# Patient Record
Sex: Male | Born: 1960
Health system: Southern US, Community
[De-identification: ages and names within clinical notes are randomized; demographics above are authoritative.]

## PROBLEM LIST (undated history)

## (undated) DIAGNOSIS — I2699 Other pulmonary embolism without acute cor pulmonale: Secondary | ICD-10-CM

## (undated) DIAGNOSIS — J45909 Unspecified asthma, uncomplicated: Secondary | ICD-10-CM

## (undated) HISTORY — DX: Unspecified asthma, uncomplicated: J45.909

## (undated) HISTORY — PX: ROTATOR CUFF REPAIR: SHX139

---

## 1988-12-06 HISTORY — PX: TONSILLECTOMY: SUR1361

## 1988-12-06 HISTORY — PX: ADENOIDECTOMY: SUR15

## 2016-08-19 DIAGNOSIS — G8929 Other chronic pain: Secondary | ICD-10-CM | POA: Insufficient documentation

## 2016-08-19 DIAGNOSIS — M25511 Pain in right shoulder: Secondary | ICD-10-CM

## 2016-08-19 HISTORY — DX: Other chronic pain: G89.29

## 2016-08-19 HISTORY — DX: Pain in right shoulder: M25.511

## 2016-12-06 HISTORY — PX: ROTATOR CUFF REPAIR: SHX139

## 2017-02-22 DIAGNOSIS — I2699 Other pulmonary embolism without acute cor pulmonale: Secondary | ICD-10-CM

## 2017-02-22 DIAGNOSIS — Z9189 Other specified personal risk factors, not elsewhere classified: Secondary | ICD-10-CM

## 2017-02-22 DIAGNOSIS — Z9889 Other specified postprocedural states: Secondary | ICD-10-CM

## 2017-02-22 DIAGNOSIS — R911 Solitary pulmonary nodule: Secondary | ICD-10-CM

## 2017-02-22 DIAGNOSIS — J189 Pneumonia, unspecified organism: Secondary | ICD-10-CM | POA: Diagnosis not present

## 2017-02-22 DIAGNOSIS — J9601 Acute respiratory failure with hypoxia: Secondary | ICD-10-CM | POA: Diagnosis not present

## 2017-02-22 HISTORY — DX: Other specified personal risk factors, not elsewhere classified: Z91.89

## 2017-02-22 HISTORY — DX: Solitary pulmonary nodule: R91.1

## 2017-02-23 DIAGNOSIS — J9601 Acute respiratory failure with hypoxia: Secondary | ICD-10-CM | POA: Diagnosis not present

## 2017-02-23 DIAGNOSIS — Z9889 Other specified postprocedural states: Secondary | ICD-10-CM | POA: Diagnosis not present

## 2017-02-23 DIAGNOSIS — I2699 Other pulmonary embolism without acute cor pulmonale: Secondary | ICD-10-CM | POA: Diagnosis not present

## 2017-02-23 DIAGNOSIS — J189 Pneumonia, unspecified organism: Secondary | ICD-10-CM | POA: Diagnosis not present

## 2017-02-24 DIAGNOSIS — I2699 Other pulmonary embolism without acute cor pulmonale: Secondary | ICD-10-CM | POA: Diagnosis not present

## 2017-02-24 DIAGNOSIS — J189 Pneumonia, unspecified organism: Secondary | ICD-10-CM | POA: Diagnosis not present

## 2017-02-24 DIAGNOSIS — Z9889 Other specified postprocedural states: Secondary | ICD-10-CM | POA: Diagnosis not present

## 2017-02-24 DIAGNOSIS — J9601 Acute respiratory failure with hypoxia: Secondary | ICD-10-CM | POA: Diagnosis not present

## 2017-02-26 ENCOUNTER — Emergency Department (HOSPITAL_COMMUNITY): Payer: Managed Care, Other (non HMO)

## 2017-02-26 ENCOUNTER — Inpatient Hospital Stay (HOSPITAL_COMMUNITY): Admit: 2017-02-26 | Payer: Managed Care, Other (non HMO)

## 2017-02-26 ENCOUNTER — Encounter (HOSPITAL_COMMUNITY): Payer: Self-pay

## 2017-02-26 ENCOUNTER — Inpatient Hospital Stay (HOSPITAL_COMMUNITY)
Admission: EM | Admit: 2017-02-26 | Discharge: 2017-02-28 | DRG: 175 | Disposition: A | Discharge status: Home / Self Care | Payer: Managed Care, Other (non HMO) | Attending: Internal Medicine | Admitting: Internal Medicine

## 2017-02-26 DIAGNOSIS — J189 Pneumonia, unspecified organism: Secondary | ICD-10-CM

## 2017-02-26 DIAGNOSIS — R0602 Shortness of breath: Secondary | ICD-10-CM

## 2017-02-26 DIAGNOSIS — R0781 Pleurodynia: Secondary | ICD-10-CM

## 2017-02-26 DIAGNOSIS — I2699 Other pulmonary embolism without acute cor pulmonale: Secondary | ICD-10-CM

## 2017-02-26 DIAGNOSIS — J181 Lobar pneumonia, unspecified organism: Secondary | ICD-10-CM

## 2017-02-26 DIAGNOSIS — R079 Chest pain, unspecified: Secondary | ICD-10-CM | POA: Diagnosis not present

## 2017-02-26 DIAGNOSIS — I2602 Saddle embolus of pulmonary artery with acute cor pulmonale: Secondary | ICD-10-CM | POA: Diagnosis not present

## 2017-02-26 DIAGNOSIS — R072 Precordial pain: Secondary | ICD-10-CM

## 2017-02-26 HISTORY — DX: Other pulmonary embolism without acute cor pulmonale: I26.99

## 2017-02-26 HISTORY — DX: Pleurodynia: R07.81

## 2017-02-26 HISTORY — DX: Pneumonia, unspecified organism: J18.9

## 2017-02-26 LAB — URINALYSIS, ROUTINE W REFLEX MICROSCOPIC
Bilirubin Urine: NEGATIVE
GLUCOSE, UA: NEGATIVE mg/dL
HGB URINE DIPSTICK: NEGATIVE
Ketones, ur: NEGATIVE mg/dL
Leukocytes, UA: NEGATIVE
Nitrite: NEGATIVE
Protein, ur: NEGATIVE mg/dL
SPECIFIC GRAVITY, URINE: 1.024 (ref 1.005–1.030)
pH: 5 (ref 5.0–8.0)

## 2017-02-26 LAB — BASIC METABOLIC PANEL
Anion gap: 12 (ref 5–15)
BUN: 18 mg/dL (ref 6–20)
CALCIUM: 9.3 mg/dL (ref 8.9–10.3)
CO2: 21 mmol/L — ABNORMAL LOW (ref 22–32)
CREATININE: 1.05 mg/dL (ref 0.61–1.24)
Chloride: 101 mmol/L (ref 101–111)
GLUCOSE: 131 mg/dL — AB (ref 65–99)
Potassium: 4.1 mmol/L (ref 3.5–5.1)
SODIUM: 134 mmol/L — AB (ref 135–145)

## 2017-02-26 LAB — CBC
HCT: 43.3 % (ref 39.0–52.0)
Hemoglobin: 14.5 g/dL (ref 13.0–17.0)
MCH: 28 pg (ref 26.0–34.0)
MCHC: 33.5 g/dL (ref 30.0–36.0)
MCV: 83.6 fL (ref 78.0–100.0)
PLATELETS: 233 10*3/uL (ref 150–400)
RBC: 5.18 MIL/uL (ref 4.22–5.81)
RDW: 12.9 % (ref 11.5–15.5)
WBC: 10.3 10*3/uL (ref 4.0–10.5)

## 2017-02-26 LAB — I-STAT TROPONIN, ED: TROPONIN I, POC: 0 ng/mL (ref 0.00–0.08)

## 2017-02-26 LAB — PROTIME-INR
INR: 1.42
Prothrombin Time: 17.5 seconds — ABNORMAL HIGH (ref 11.4–15.2)

## 2017-02-26 LAB — BRAIN NATRIURETIC PEPTIDE: B Natriuretic Peptide: 15.5 pg/mL (ref 0.0–100.0)

## 2017-02-26 LAB — STREP PNEUMONIAE URINARY ANTIGEN: Strep Pneumo Urinary Antigen: NEGATIVE

## 2017-02-26 MED ORDER — DEXTROSE 5 % IV SOLN
500.0000 mg | INTRAVENOUS | Status: DC
  Administered 2017-02-26 – 2017-02-27 (×2): 500 mg via INTRAVENOUS
  Filled 2017-02-26 (×3): qty 500

## 2017-02-26 MED ORDER — ACETAMINOPHEN 325 MG PO TABS
650.0000 mg | ORAL_TABLET | Freq: Four times a day (QID) | ORAL | Status: DC | PRN
  Administered 2017-02-26: 650 mg via ORAL
  Filled 2017-02-26: qty 2

## 2017-02-26 MED ORDER — HYDROMORPHONE HCL 1 MG/ML IJ SOLN
1.0000 mg | Freq: Once | INTRAMUSCULAR | Status: AC
Start: 2017-02-26 — End: 2017-02-26
  Administered 2017-02-26: 1 mg via INTRAVENOUS
  Filled 2017-02-26: qty 1

## 2017-02-26 MED ORDER — SODIUM CHLORIDE 0.9% FLUSH
3.0000 mL | Freq: Two times a day (BID) | INTRAVENOUS | Status: DC
Start: 2017-02-26 — End: 2017-02-28
  Administered 2017-02-26 – 2017-02-27 (×2): 3 mL via INTRAVENOUS

## 2017-02-26 MED ORDER — IBUPROFEN 800 MG PO TABS: 800.0000 mg | ORAL_TABLET | Freq: Four times a day (QID) | ORAL | Status: DC | PRN

## 2017-02-26 MED ORDER — METHOCARBAMOL 500 MG PO TABS
500.0000 mg | ORAL_TABLET | Freq: Four times a day (QID) | ORAL | Status: DC | PRN
  Administered 2017-02-28: 500 mg via ORAL
  Filled 2017-02-26: qty 1

## 2017-02-26 MED ORDER — FLEET ENEMA 7-19 GM/118ML RE ENEM: 1.0000 | ENEMA | Freq: Once | RECTAL | Status: DC | PRN

## 2017-02-26 MED ORDER — SODIUM CHLORIDE 0.9% FLUSH: 3.0000 mL | INTRAVENOUS | Status: DC | PRN

## 2017-02-26 MED ORDER — DEXTROSE 5 % IV SOLN
1.0000 g | INTRAVENOUS | Status: DC
  Administered 2017-02-26 – 2017-02-27 (×2): 1 g via INTRAVENOUS
  Filled 2017-02-26 (×4): qty 10

## 2017-02-26 MED ORDER — DOCUSATE SODIUM 100 MG PO CAPS
100.0000 mg | ORAL_CAPSULE | Freq: Two times a day (BID) | ORAL | Status: DC
  Administered 2017-02-26 – 2017-02-28 (×5): 100 mg via ORAL
  Filled 2017-02-26 (×5): qty 1

## 2017-02-26 MED ORDER — SENNOSIDES-DOCUSATE SODIUM 8.6-50 MG PO TABS: 1.0000 | ORAL_TABLET | Freq: Every evening | ORAL | Status: DC | PRN

## 2017-02-26 MED ORDER — OXYCODONE-ACETAMINOPHEN 5-325 MG PO TABS
1.0000 | ORAL_TABLET | ORAL | Status: DC | PRN
  Administered 2017-02-26 – 2017-02-27 (×4): 2 via ORAL
  Filled 2017-02-26 (×4): qty 2

## 2017-02-26 MED ORDER — ENOXAPARIN SODIUM 100 MG/ML ~~LOC~~ SOLN
100.0000 mg | Freq: Two times a day (BID) | SUBCUTANEOUS | Status: DC
  Administered 2017-02-26 – 2017-02-28 (×4): 100 mg via SUBCUTANEOUS
  Filled 2017-02-26 (×4): qty 1

## 2017-02-26 MED ORDER — KETOROLAC TROMETHAMINE 30 MG/ML IJ SOLN
30.0000 mg | Freq: Four times a day (QID) | INTRAMUSCULAR | Status: DC | PRN
  Administered 2017-02-26: 30 mg via INTRAVENOUS
  Filled 2017-02-26: qty 1

## 2017-02-26 MED ORDER — BISACODYL 10 MG RE SUPP: 10.0000 mg | Freq: Every day | RECTAL | Status: DC | PRN

## 2017-02-26 MED ORDER — IOPAMIDOL (ISOVUE-370) INJECTION 76%
INTRAVENOUS | Status: AC
  Administered 2017-02-26: 100 mL via INTRAVENOUS
  Filled 2017-02-26: qty 100

## 2017-02-26 MED ORDER — ENOXAPARIN SODIUM 100 MG/ML ~~LOC~~ SOLN
100.0000 mg | SUBCUTANEOUS | Status: DC
  Filled 2017-02-26: qty 1

## 2017-02-26 MED ORDER — MORPHINE SULFATE (PF) 4 MG/ML IV SOLN
4.0000 mg | Freq: Once | INTRAVENOUS | Status: DC
  Filled 2017-02-26: qty 1

## 2017-02-26 MED ORDER — ENOXAPARIN SODIUM 100 MG/ML ~~LOC~~ SOLN
100.0000 mg | SUBCUTANEOUS | Status: AC
  Administered 2017-02-26: 100 mg via SUBCUTANEOUS
  Filled 2017-02-26: qty 1

## 2017-02-26 MED ORDER — SODIUM CHLORIDE 0.9 % IV SOLN: 250.0000 mL | INTRAVENOUS | Status: DC | PRN

## 2017-02-26 NOTE — ED Notes (Signed)
Pt aware that urine sample is needed.  

## 2017-02-26 NOTE — H&P (Signed)
[facility] History and Physical  Eric SellsSteven Nanez OZH:[id number] DOB: May 08, 1961 DOA: 02/26/2017  Referring physician: ED MD PCP: Noni SaupeEDDING II,JOHN F., MD   Chief Complaint: pleuritic chest pain  HPI: Eric Myers is a 56 y.o. male with no significant past medical history until about 2 weeks ago when he had right shoulder arthroscope. He secondary to rotator cuff repair.  He subsequently developed shortness of breath, increasing tachycardia and tachypnea and dyspnea on exertion, went to [facility] on [facility]/22/2017 and was found to have new acute pulmonary embolus  as well as pneumonia.  He was transitioned from Lovenox to several total on discharge and sent home with Levaquin 750 mg by mouth daily.  He was doing okay until last night when he developed worsening pleuritic chest pain, tachypnea, tachycardia, and inability to get comfortable. Of note, he also developed a low-grade temperature of 100.9 last night; he was taking Percocets for discomfort as well as fever He came to ED for further evaluation and was noted to have NEW distal right middle lobe PE as well as right heart strain.  Since arrival to the ED, he feels much more comfortable with pain medications.  Of note, patient was having productive cough at home but denies any chest pain.  No dysuria, hematuria, hematochezia, no hemoptysis.  Does not smoke, does not drink alcohol family history negative for hypercoagulable state. No recent lengthy travel prior or  Posts the right shoulder surgery.  Per wife, bilateral lower sternal ultrasounds were done on the 02/22/17 and they were negative for DVTs in the legs.  In ED, initial VS 99.4, 24rr, hr 94, 147/77 Comfortable on my arrival to ED room, wife at bedside and giving me all hx.   Review of Systems:  Per hpi, o/w all systems reviewed and negative.    Past Medical History:  Diagnosis Date  . Pulmonary embolism [facility](HCC)    Past Surgical History:  Procedure Laterality Date  .  ROTATOR CUFF REPAIR     Social History:  reports that he has never smoked. He has never used smokeless tobacco. He reports that he does not drink alcohol. His drug history is not on file.  Allergies  Allergen Reactions  . Morphine And Related Itching and Other (See Comments)    Rashy red areas and doesn't help pain    No family history on file. per pt no family hx of hypercoag state.  Prior to Admission medications   Medication Sig Start Date End Date Taking? Authorizing Provider  levofloxacin (LEVAQUIN) 750 MG tablet Take 750 mg by mouth daily. For 10 days 02/24/17  Yes Historical Provider, MD  methocarbamol (ROBAXIN) 500 MG tablet Take 500 mg by mouth every 6 (six) hours as needed for muscle spasms.   Yes Historical Provider, MD  oxyCODONE-acetaminophen (PERCOCET/ROXICET) 5-325 MG tablet Take 1-2 tablets by mouth every 4 (four) hours as needed for severe pain.   Yes Historical Provider, MD  Rivaroxaban (XARELTO) 15 MG TABS tablet Take 15 mg by mouth 2 (two) times daily with a meal.   Yes Historical Provider, MD   Physical Exam: Vitals:   02/26/17 1030 02/26/17 1100 02/26/17 1130 02/26/17 1200  BP: 131/70 121/83 126/79 115/79  Pulse: 80 77 76 91  Resp: 20 19 20  (!) 24  Temp:      TempSrc:      SpO2: 98% 99% 98% 98%  Weight:      Height:        Wt Readings from Last 3  Encounters:  02/26/17 98.9 kg (218 lb)    General:  Appears calm and comfortable, pleasant, NAD, AAOx3, pleasant Eyes: PERRL, normal lids, irises & conjunctiva ENT: grossly normal hearing, lips & tongue, mmm Neck: no LAD, masses or thyromegaly Cardiovascular: RRR, no m/r/g. No LE edema. No jvp. Telemetry: SR, no arrhythmias  Respiratory: diminished bs due to splinting. Abdomen: soft, ntnd Skin: no rash or induration seen on limited exam Musculoskeletal: grossly normal tone BUE/BLE, neg Homan's sign bilat. Psychiatric: grossly normal mood and affect, speech fluent and appropriate Neurologic: grossly  non-focal.          Labs on Admission:  Basic Metabolic Panel:  Recent Labs Lab 02/26/17 0715  NA 134*  K 4.1  CL 101  CO2 21*  GLUCOSE 131*  BUN 18  CREATININE 1.05  CALCIUM 9.3   Liver Function Tests: No results for input(s): AST, ALT, ALKPHOS, BILITOT, PROT, ALBUMIN in the last 168 hours. No results for input(s): LIPASE, AMYLASE in the last 168 hours. No results for input(s): AMMONIA in the last 168 hours. CBC:  Recent Labs Lab 02/26/17 0715  WBC 10.3  HGB 14.5  HCT 43.3  MCV 83.6  PLT 233   Cardiac Enzymes: No results for input(s): CKTOTAL, CKMB, CKMBINDEX, TROPONINI in the last 168 hours.  BNP (last 3 results)  Recent Labs  02/26/17 0715  BNP 15.5    ProBNP (last 3 results) No results for input(s): PROBNP in the last 8760 hours.  CBG: No results for input(s): GLUCAP in the last 168 hours.  Radiological Exams on Admission: Dg Chest 2 View  Result Date: 02/26/2017 CLINICAL DATA:  Shortness of breath and chest pain EXAM: CHEST  2 VIEW COMPARISON:  None. FINDINGS: There is atelectatic change in the lateral left base. Lungs elsewhere are clear. Heart size and pulmonary vascularity are normal. No adenopathy. No bone lesions. IMPRESSION: Atelectasis lateral left base. Lungs elsewhere clear. Cardiac silhouette within normal limits. Electronically Signed   By: Bretta Bang III M.D.   On: 02/26/2017 07:54   Ct Angio Chest Pe W And/or Wo Contrast  Result Date: 02/26/2017 CLINICAL DATA:  Patient with history of pneumonia right-sided pulmonary embolus. Worsening right-sided chest pain. EXAM: CT ANGIOGRAPHY CHEST WITH CONTRAST TECHNIQUE: Multidetector CT imaging of the chest was performed using the standard protocol during bolus administration of intravenous contrast. Multiplanar CT image reconstructions and MIPs were obtained to evaluate the vascular anatomy. CONTRAST:  100 cc Isovue 370 COMPARISON:  Chest radiograph 02/26/2017 PE study 02/22/2017 FINDINGS:  Cardiovascular: Adequate opacification of the pulmonary arterial system. Filling defects are identified within the right middle and lower lobe lobar and segmental pulmonary arteries. Filling defect within the right middle lobe is new from prior. Additionally new filling defects are demonstrated within the left upper lobe segmental pulmonary arteries. RV/LV ratio of 1.1. Normal heart size. No pericardial effusion. Ascending thoracic aorta measures 4.3 cm. Main pulmonary artery upper limits normal measuring 3.0 cm. Mediastinum/Nodes: Esophagus is unremarkable. Prominent and mildly enlarged right peritracheal lymph nodes measuring up to 11 mm (image 48; series 6). Additionally, within the right peritracheal location there is prominent soft tissue (image 82; series 6) measuring 1.9 cm in diameter, potentially secondary to on opacified azygos. Lungs/Pleura: Central airways are patent. Moderate layering right pleural effusion. Subpleural consolidation within the right lower lobe. Patchy consolidation within the lingula. No pneumothorax. Upper Abdomen: No acute process. Musculoskeletal: Thoracic spine degenerative changes. No aggressive or acute appearing osseous lesions. Review of the MIP images confirms  the above findings. IMPRESSION: Findings compatible with acute pulmonary embolus. There appears to be new thrombus within the distal right middle lobe pulmonary arteries and lingular pulmonary arteries. Minimal residual thrombus within the right lower lobe pulmonary arteries. Positive for acute PE with CT evidence of right heart strain (RV/LV Ratio = 1.1) consistent with at least submassive (intermediate risk) PE. The presence of right heart strain has been associated with an increased risk of morbidity and mortality. Please activate Code PE by paging [phone]. Moderate layering right pleural effusion. Subpleural consolidation within the right lower lobe as well as patchy consolidation within the lingula which may  represent atelectasis or infection. Prominent mildly enlarged right peritracheal lymph nodes. Additionally there is prominent soft tissue at the expected location of the azygos, potentially representing non-opacified azygos. Lymph nodes may be reactive in etiology. Given multitude of pulmonary findings, recommend follow-up chest CT in 2- 3 months to assess for interval resolution. Ascending thoracic aorta measures 4.3 cm. Recommend annual imaging followup by CTA or MRA. This recommendation follows 2010 ACCF/AHA/AATS/ACR/ASA/SCA/SCAI/SIR/STS/SVM Guidelines for the Diagnosis and Management of Patients with Thoracic Aortic Disease. Circulation. 2010; 121: G956-O130 Critical Value/emergent results were called by telephone at the time of interpretation on 02/26/2017 at 10:41 am to Dr. Lynden Oxford , who verbally acknowledged these results. Electronically Signed   By: Annia Belt M.D.   On: 02/26/2017 10:52    EKG: Independently reviewed.   EKG Interpretation  Date/Time:  Saturday February 26 2017 06:49:26 EDT Ventricular Rate:  98 PR Interval:  144 QRS Duration: 90 QT Interval:  338 QTC Calculation: 431 R Axis:   53 Text Interpretation:  Normal sinus rhythm Normal ECG No prior ECG for comparison.  No STEMI Confirmed by Rush Landmark MD, CHRISTOPHER [phone]) on 02/26/2017 7:23:38 AM Also confirmed by Rush Landmark MD, CHRISTOPHER [phone]), editor Misty Stanley [phone])  on 02/26/2017 10:27:05 AM        Assessment/Plan Principal Problem:   Acute pulmonary embolism (HCC) Active Problems:   RLL pneumonia (HCC)   Pleuritic chest pain   1. New PE, bilat, w/ Right heart strain - Initially diagnosed on 02/22/2017 at [facility]: Lambert Rhonda W, however radiologist's reading a new pulmonary emboli noted in the distal right middle lobe pulmonary arteries and lingular pulmonary arteries as compared to [facility] CT on 02/22/2017. - obs tele - emp lovenox 1mg /kg bid for now, hold lovenox - pulm cs,  ER called, appreciate assistance - o2 for now, Resp to check pt's O2 status off o2 while ambulating tomorw to eval home o2 needs - chk echo and bilat LE [place]. - no hypercoag wkup done recently per pt/wife, suspect this occurrence of VTE due to recent surgery, but given severity of PE, may consider hypercoag wkup once treatment completion.  2. rll and lingula consolidation, c/w pna - pt was on levoquin 750mg  at home - will switch to rocephin and azithromycin for now - fu sputum cs/urine legionella and strep  3. Plurisy/pleuritic cp/doe - neg trop on arrival, no ACS - suspect due to #1 and #2 - pain control w/ motrin/percocets, prn toradol iv for severe pain. Pt has allergy to morphine - made allergy.  I requested pulm cs, ER called for me, appreciate assistance  Code Status: full (must indicate code status--if unknown or must be presumed, indicate so) DVT Prophylaxis: lovenox 1mg /kig bid for now, hold xarelto Family Communication: pt and wife at bedside (indicate person spoken with, if applicable, with phone number if by telephone) Disposition Plan: obs tele (  indicate anticipated LOS)  Time spent:  Pete Glatter MD., MBA/MHA [facility] Pager [phone]

## 2017-02-26 NOTE — ED Triage Notes (Signed)
Pt was recently discharged from [facility] for R sided PE, recent rotator cuff surgery, last night pt became very SOB and having CP, states this feels the same as his last PE.

## 2017-02-26 NOTE — ED Notes (Signed)
Wife at bedside questioning why pt is ordered to have blood cultures drawn. Explained to wife that pt has antibiotics ordered used for pneumonia. Wife reports pt does have pneumonia and he has been on antibiotics for 1 week. Wife feels blood cultures would be useless at this time. Admitting Dr paged to inform of same.

## 2017-02-26 NOTE — Progress Notes (Signed)
ANTICOAGULATION CONSULT NOTE - Initial Consult  Pharmacy Consult:  Lovenox Indication:  New RML PE with recent PE  Allergies  Allergen Reactions  . Morphine And Related Itching and Other (See Comments)    Rashy red areas and doesn't help pain    Patient Measurements: Height: 6\' 1"  (185.4 cm) Weight: 218 lb (98.9 kg) IBW/kg (Calculated) : 79.9  Vital Signs: Temp: 99.4 F (37.4 C) (03/24 0647) Temp Source: Oral (03/24 0647) BP: 115/79 (03/24 1200) Pulse Rate: 91 (03/24 1200)  Labs:  Recent Labs  02/26/17 0715  HGB 14.5  HCT 43.3  PLT 233  LABPROT 17.5*  INR 1.42  CREATININE 1.05    Estimated Creatinine Clearance: 100.7 mL/min (by C-G formula based on SCr of 1.05 mg/dL).   Medical History: Past Medical History:  Diagnosis Date  . Pulmonary embolism (HCC)       Assessment: 89 YOM with history of right should arthroscopy about 2 weeks ago.  Patient presented to [facility] on 02/22/17 and was diagnosed with PE and PNA.  He was given Lovenox during that hospitalization and discharged home on 02/24/17 on Xarelto that started the same night.  [facility] with DOE, pleuritic chest pain and fevers.  CT shows new RML thrombus.  Pharmacy consulted to initiate Lovenox.  His last dose of Xarelto was on 02/25/17 around 1900.  Baseline labs reviewed.   Goal of Therapy:  Anti-Xa level 0.6-1 units/ml 4hrs after LMWH dose given Monitor platelets by anticoagulation protocol: Yes    Plan:  - Lovenox 100mg  SQ Q12H - CBC Q72H while on Lovenox - F/U with transitioning back to PO anticoagulation when appropriate    Kaetlyn Noa D. Laney Potashang, PharmD, BCPS Pager:  [phone] 02/26/2017, 1:01 PM

## 2017-02-26 NOTE — H&P (Signed)
[facility] History and Physical  Eric SellsSteven Sadik [id number] DOB: 1961/03/12 DOA: 02/26/2017  Referring physician: ER MD PCP: Noni SaupeEDDING II,JOHN F., MD   Chief Complaint: doe/pleuritic cp/fevers  HPI: Eric SellsSteven Myers is a 56 y.o. male w/ recent hx of Right shoulder rotator cuff surgery about 2 wks ago.  He was admitted [facility] days ago for increasing sob/doe/pleuritic pain, and dx w/ pulmonary embolism, initially trx w/ lovenox in hospital, and found with concomitant pna. bilat LE [facility] were negative as well per wife. D/c home w/ levaquin and xarelto 15mg  po bid 2 days ago.  He present to our ED today due to worsening pleuritic chest pain, pleurisy, doe, low grade fever 100.9 overnight.  No recent travels before or after surgery, no family hx of hypercoag state. Pt does not smoke/drink etoh.  No other prior PMHx.  In ED,  Initial VS 99.4, rr 24, hr 94, bp 147/77, 97% ra. On evaluation in ED, pt currently comfortable, denies acute pain.   Wife is at bedside and giving me most of pt's hx.    Review of Systems:  Currently pt states he is feeling better, denies acute pain. bms regular. Denies cp /palpitations/currently cough.  Was having productive coughing/chest tightness at home prior to arrival.  No dysuria.  No n/v/abd pains. o/w, Negative except per hpi.  Past Medical History:  Diagnosis Date  . Pulmonary embolism [facility](HCC)    Past Surgical History:  Procedure Laterality Date  . ROTATOR CUFF REPAIR     Social History:  reports that he has never smoked. He has never used smokeless tobacco. He reports that he does not drink alcohol. His drug history is not on file.  Allergies  Allergen Reactions  . Morphine And Related Itching and Other (See Comments)    Rashy red areas and doesn't help pain    No family history on file. no family hx of hypercoag state.  Prior to Admission medications   Medication Sig Start Date End Date Taking? Authorizing  Provider  levofloxacin (LEVAQUIN) 750 MG tablet Take 750 mg by mouth daily. For 10 days 02/24/17  Yes Historical Provider, MD  methocarbamol (ROBAXIN) 500 MG tablet Take 500 mg by mouth every 6 (six) hours as needed for muscle spasms.   Yes Historical Provider, MD  oxyCODONE-acetaminophen (PERCOCET/ROXICET) 5-325 MG tablet Take 1-2 tablets by mouth every 4 (four) hours as needed for severe pain.   Yes Historical Provider, MD  Rivaroxaban (XARELTO) 15 MG TABS tablet Take 15 mg by mouth 2 (two) times daily with a meal.   Yes Historical Provider, MD   Physical Exam: Vitals:   02/26/17 0930 02/26/17 1026 02/26/17 1030 02/26/17 1100  BP: 117/78 126/75 131/70 121/83  Pulse: 76 80 80 77  Resp: 18 19 20 19   Temp:      TempSrc:      SpO2: 97% 98% 98% 99%  Weight:      Height:        Wt Readings from Last 3 Encounters:  02/26/17 98.9 kg (218 lb)    General:  Appears calm and comfortable, pleasant, NAD, AAOx3, tired appearing. Eyes: PERRL, normal lids, irises & conjunctiva ENT: grossly normal hearing, lips & tongue, mmm Neck: no LAD, masses or thyromegaly Cardiovascular: RRR, no m/r/g. No LE edema.  No jvp. Telemetry: SR, no arrhythmias  Respiratory:  diminished bs diffusely due to splinting, not using accessory muscles. Abdomen: soft, ntnd Skin: no rash or induration seen on limited  exam Musculoskeletal: grossly normal tone BUE/BLE, neg Homan's sign bilat.  Psychiatric: grossly normal mood and affect, speech fluent and appropriate Neurologic: grossly non-focal.          Labs on Admission:  Basic Metabolic Panel:  Recent Labs Lab 02/26/17 0715  NA 134*  K 4.1  CL 101  CO2 21*  GLUCOSE 131*  BUN 18  CREATININE 1.05  CALCIUM 9.3   Liver Function Tests: No results for input(s): AST, ALT, ALKPHOS, BILITOT, PROT, ALBUMIN in the last 168 hours. No results for input(s): LIPASE, AMYLASE in the last 168 hours. No results for input(s): AMMONIA in the last 168 hours. CBC:  Recent  Labs Lab 02/26/17 0715  WBC 10.3  HGB 14.5  HCT 43.3  MCV 83.6  PLT 233   Cardiac Enzymes: No results for input(s): CKTOTAL, CKMB, CKMBINDEX, TROPONINI in the last 168 hours.  BNP (last 3 results)  Recent Labs  02/26/17 0715  BNP 15.5    ProBNP (last 3 results) No results for input(s): PROBNP in the last 8760 hours.  CBG: No results for input(s): GLUCAP in the last 168 hours.  Radiological Exams on Admission: Dg Chest 2 View  Result Date: 02/26/2017 CLINICAL DATA:  Shortness of breath and chest pain EXAM: CHEST  2 VIEW COMPARISON:  None. FINDINGS: There is atelectatic change in the lateral left base. Lungs elsewhere are clear. Heart size and pulmonary vascularity are normal. No adenopathy. No bone lesions. IMPRESSION: Atelectasis lateral left base. Lungs elsewhere clear. Cardiac silhouette within normal limits. Electronically Signed   By: Bretta Bang III M.D.   On: 02/26/2017 07:54   Ct Angio Chest Pe W And/or Wo Contrast  Result Date: 02/26/2017 CLINICAL DATA:  Patient with history of pneumonia right-sided pulmonary embolus. Worsening right-sided chest pain. EXAM: CT ANGIOGRAPHY CHEST WITH CONTRAST TECHNIQUE: Multidetector CT imaging of the chest was performed using the standard protocol during bolus administration of intravenous contrast. Multiplanar CT image reconstructions and MIPs were obtained to evaluate the vascular anatomy. CONTRAST:  100 cc Isovue 370 COMPARISON:  Chest radiograph 02/26/2017 PE study 02/22/2017 FINDINGS: Cardiovascular: Adequate opacification of the pulmonary arterial system. Filling defects are identified within the right middle and lower lobe lobar and segmental pulmonary arteries. Filling defect within the right middle lobe is new from prior. Additionally new filling defects are demonstrated within the left upper lobe segmental pulmonary arteries. RV/LV ratio of 1.1. Normal heart size. No pericardial effusion. Ascending thoracic aorta measures 4.3  cm. Main pulmonary artery upper limits normal measuring 3.0 cm. Mediastinum/Nodes: Esophagus is unremarkable. Prominent and mildly enlarged right peritracheal lymph nodes measuring up to 11 mm (image 48; series 6). Additionally, within the right peritracheal location there is prominent soft tissue (image 82; series 6) measuring 1.9 cm in diameter, potentially secondary to on opacified azygos. Lungs/Pleura: Central airways are patent. Moderate layering right pleural effusion. Subpleural consolidation within the right lower lobe. Patchy consolidation within the lingula. No pneumothorax. Upper Abdomen: No acute process. Musculoskeletal: Thoracic spine degenerative changes. No aggressive or acute appearing osseous lesions. Review of the MIP images confirms the above findings. IMPRESSION: Findings compatible with acute pulmonary embolus. There appears to be new thrombus within the distal right middle lobe pulmonary arteries and lingular pulmonary arteries. Minimal residual thrombus within the right lower lobe pulmonary arteries. Positive for acute PE with CT evidence of right heart strain (RV/LV Ratio = 1.1) consistent with at least submassive (intermediate risk) PE. The presence of right heart strain has been associated with  an increased risk of morbidity and mortality. Please activate Code PE by paging [phone]. Moderate layering right pleural effusion. Subpleural consolidation within the right lower lobe as well as patchy consolidation within the lingula which may represent atelectasis or infection. Prominent mildly enlarged right peritracheal lymph nodes. Additionally there is prominent soft tissue at the expected location of the azygos, potentially representing non-opacified azygos. Lymph nodes may be reactive in etiology. Given multitude of pulmonary findings, recommend follow-up chest CT in 2- 3 months to assess for interval resolution. Ascending thoracic aorta measures 4.3 cm. Recommend annual imaging  followup by CTA or MRA. This recommendation follows 2010 ACCF/AHA/AATS/ACR/ASA/SCA/SCAI/SIR/STS/SVM Guidelines for the Diagnosis and Management of Patients with Thoracic Aortic Disease. Circulation. 2010; 121: H086-V784 Critical Value/emergent results were called by telephone at the time of interpretation on 02/26/2017 at 10:41 am to Dr. Lynden Oxford , who verbally acknowledged these results. Electronically Signed   By: Annia Belt M.D.   On: 02/26/2017 10:52    EKG: Independently reviewed.   EKG Interpretation  Date/Time:  Saturday February 26 2017 06:49:26 EDT Ventricular Rate:  98 PR Interval:  144 QRS Duration: 90 QT Interval:  338 QTC Calculation: 431 R Axis:   53 Text Interpretation:  Normal sinus rhythm Normal ECG No prior ECG for comparison.  No STEMI Confirmed by Rush Landmark MD, CHRISTOPHER [phone]) on 02/26/2017 7:23:38 AM Also confirmed by Rush Landmark MD, CHRISTOPHER [phone]), editor Misty Stanley [phone])  on 02/26/2017 10:27:05 AM        Assessment/Plan Principal Problem:   Acute pulmonary embolism (HCC) Active Problems:   RLL pneumonia (HCC)   Pleuritic chest pain   1. Acute bilat PE, w/ right heart strain, w/ worse sob/doe/pleuritic chest pain this am, with NEW distal right middle lobe pulmonary arteries and lingular pulmonary arteries thrombus compared to recent scan at [facility] per Radiology. - initially dx at [facility]/25/17, was on xarelto 15bid at home, VTE suspected due to recent right shoulder arthroscopy due to rotator cuff tear. - obs tele - lovenox 1mg /kg bid for now, pharm to assist w/ dosing - asked Pulm c/s (er to call) for further recds, - appreciate assistance. - check echo due to right heart strain, chk bilat LE [place]. - consider hypercoag wkup once off anticoagulation, but suspect postsurg related., no family hx of hypercoag state, pt does not smoke  2. rll and lingula pna - dx on 03/25/17 as well, pt was on home levoquin 750qd - chk sputum  cx/ urine leg/strep - chg to rocephin /azithromycin for now.  3. Pleuritic chest pain - likely due to #1 and #2. - trop neg.     Pulmonary c/s, asked ER to call.  Code Status: full code (must indicate code status--if unknown or must be presumed, indicate so) DVT Prophylaxis:  lovenox 1mg  /kg bid for now, holding xarelto Family Communication: pt and wife at bedside (indicate person spoken with, if applicable, with phone number if by telephone) Disposition Plan: obs tele (indicate anticipated LOS)  Time spent:  Pete Glatter MD., MBA/MHA [facility] Pager [phone]

## 2017-02-26 NOTE — ED Notes (Signed)
Pt given water to drink per Donnamae JudeKeshia, RN

## 2017-02-26 NOTE — ED Provider Notes (Addendum)
[facility]-EMERGENCY DEPT Provider Note   CSN: [id number] Arrival date & time: 02/26/17  4098     History   Chief Complaint Chief Complaint  Patient presents with  . Chest Pain  . Shortness of Breath    HPI Eric Myers is a 56 y.o. male with a past medical history significant for diagnosis of pulmonary embolism 4 days ago on several toe and recent pneumonia Levaquin who presents with worsening chest pain and shortness of breath. Patient reports that 2 weeks ago, he had a rotator cuff surgery and several days ago developed chest pain and shortness of breath. Patient was treated at [facility] where he was diagnosed with a pulmonary embolism. Patient reports initial treatment with Lovenox and then transition to several toe. Patient was discharged 2 days ago after diagnosis with concomitant pneumonia. Patient currently on Levaquin. Patient says that he did well at discharge however, yesterday evening began having return of his chest pain. He describes the pain as up to a 10 out of 10, radiating from the right lower chest up towards his jaw, associated with shortness of breath, pleurisy, and return if fever. Patient has his fever was 100.9 prior to taking his Percocet for the discomfort and fever. Patient denies urinary symptoms, constipation, or diarrhea.  Patient reports his pain has improved a 5 out of 10 after his Percocet.    The history is provided by the patient and a significant other. No language interpreter was used.  Chest Pain   This is a recurrent problem. The current episode started yesterday. The problem occurs constantly. The problem has been gradually worsening. The pain is associated with exertion and breathing. The pain is present in the lateral region. The pain is at a severity of 10/10. The pain is severe. The quality of the pain is described as exertional, pleuritic and pressure-like. The pain radiates to the right neck and right jaw. Duration of episode(s) is 1 day.  The symptoms are aggravated by deep breathing and exertion. Associated symptoms include cough, exertional chest pressure, shortness of breath and sputum production. Pertinent negatives include no abdominal pain, no back pain, no diaphoresis, no fever, no headaches, no nausea, no palpitations and no syncope. Treatments tried: percocet. The treatment provided moderate relief.  His past medical history is significant for PE.    Past Medical History:  Diagnosis Date  . Pulmonary embolism (HCC)     There are no active problems to display for this patient.   Past Surgical History:  Procedure Laterality Date  . ROTATOR CUFF REPAIR         Home Medications    Prior to Admission medications   Not on File    Family History No family history on file.  Social History Social History  Substance Use Topics  . Smoking status: Never Smoker  . Smokeless tobacco: Never Used  . Alcohol use No     Allergies   Patient has no known allergies.   Review of Systems Review of Systems  Constitutional: Negative for appetite change, chills, diaphoresis, fatigue and fever.  HENT: Negative for congestion.   Eyes: Negative for visual disturbance.  Respiratory: Positive for cough, sputum production, chest tightness and shortness of breath. Negative for wheezing and stridor.   Cardiovascular: Positive for chest pain. Negative for palpitations, leg swelling and syncope.  Gastrointestinal: Negative for abdominal pain, constipation, diarrhea and nausea.  Genitourinary: Negative for dysuria.  Musculoskeletal: Negative for back pain, neck pain and neck stiffness.  Skin: Negative  for rash and wound.  Neurological: Negative for headaches.  All other systems reviewed and are negative.    Physical Exam Updated Vital Signs BP (!) 147/77 (BP Location: Left Arm)   Pulse 94   Temp 99.4 F (37.4 C) (Oral)   Resp (!) 24   Ht 6\' 1"  (1.854 m)   Wt 218 lb (98.9 kg)   SpO2 97%   BMI 28.76 kg/m    Physical Exam  Constitutional: He is oriented to person, place, and time. He appears well-developed and well-nourished. No distress.  HENT:  Head: Normocephalic and atraumatic.  Right Ear: External ear normal.  Left Ear: External ear normal.  Nose: Nose normal.  Mouth/Throat: Oropharynx is clear and moist. No oropharyngeal exudate.  Eyes: Conjunctivae and EOM are normal. Pupils are equal, round, and reactive to light.  Neck: Normal range of motion. Neck supple.  Cardiovascular: Normal rate, normal heart sounds and intact distal pulses.   No murmur heard. Pulmonary/Chest: Effort normal. No stridor. Tachypnea noted. No respiratory distress. He has rhonchi. He exhibits no tenderness.  Abdominal: Soft. There is no tenderness. There is no rebound and no guarding.  Musculoskeletal: He exhibits no edema or tenderness.  Neurological: He is alert and oriented to person, place, and time. He displays normal reflexes. No cranial nerve deficit. He exhibits normal muscle tone. Coordination normal.  Skin: Skin is warm. No rash noted. He is not diaphoretic. No erythema.  Vitals reviewed.    ED Treatments / Results  Labs (all labs ordered are listed, but only abnormal results are displayed) Labs Reviewed  BASIC METABOLIC PANEL - Abnormal; Notable for the following:       Result Value   Sodium 134 (*)    CO2 21 (*)    Glucose, Bld 131 (*)    All other components within normal limits  PROTIME-INR - Abnormal; Notable for the following:    Prothrombin Time 17.5 (*)    All other components within normal limits  CULTURE, EXPECTORATED SPUTUM-ASSESSMENT  GRAM STAIN  CBC  BRAIN NATRIURETIC PEPTIDE  URINALYSIS, ROUTINE W REFLEX MICROSCOPIC  STREP PNEUMONIAE URINARY ANTIGEN  HIV ANTIBODY (ROUTINE TESTING)  LEGIONELLA PNEUMOPHILA SEROGP 1 UR AG  COMPREHENSIVE METABOLIC PANEL  CBC  PROTIME-INR  APTT  I-STAT TROPOININ, ED    EKG  EKG Interpretation  Date/Time:  Saturday February 26 2017  06:49:26 EDT Ventricular Rate:  98 PR Interval:  144 QRS Duration: 90 QT Interval:  338 QTC Calculation: 431 R Axis:   53 Text Interpretation:  Normal sinus rhythm Normal ECG No prior ECG for comparison.  No STEMI Confirmed by Rush LandmarkEGELER MD, CHRISTOPHER [phone]) on 02/26/2017 7:23:38 AM Also confirmed by Rush LandmarkEGELER MD, CHRISTOPHER [phone]), editor Misty StanleyScales-Price, Shannon [phone])  on 02/26/2017 10:27:05 AM       Radiology Dg Chest 2 View  Result Date: 02/26/2017 CLINICAL DATA:  Shortness of breath and chest pain EXAM: CHEST  2 VIEW COMPARISON:  None. FINDINGS: There is atelectatic change in the lateral left base. Lungs elsewhere are clear. Heart size and pulmonary vascularity are normal. No adenopathy. No bone lesions. IMPRESSION: Atelectasis lateral left base. Lungs elsewhere clear. Cardiac silhouette within normal limits. Electronically Signed   By: Bretta BangWilliam  Woodruff III M.D.   On: 02/26/2017 07:54   Ct Angio Chest Pe W And/or Wo Contrast  Result Date: 02/26/2017 CLINICAL DATA:  Patient with history of pneumonia right-sided pulmonary embolus. Worsening right-sided chest pain. EXAM: CT ANGIOGRAPHY CHEST WITH CONTRAST TECHNIQUE: Multidetector CT imaging of the chest  was performed using the standard protocol during bolus administration of intravenous contrast. Multiplanar CT image reconstructions and MIPs were obtained to evaluate the vascular anatomy. CONTRAST:  100 cc Isovue 370 COMPARISON:  Chest radiograph 02/26/2017 PE study 02/22/2017 FINDINGS: Cardiovascular: Adequate opacification of the pulmonary arterial system. Filling defects are identified within the right middle and lower lobe lobar and segmental pulmonary arteries. Filling defect within the right middle lobe is new from prior. Additionally new filling defects are demonstrated within the left upper lobe segmental pulmonary arteries. RV/LV ratio of 1.1. Normal heart size. No pericardial effusion. Ascending thoracic aorta measures 4.3 cm. Main  pulmonary artery upper limits normal measuring 3.0 cm. Mediastinum/Nodes: Esophagus is unremarkable. Prominent and mildly enlarged right peritracheal lymph nodes measuring up to 11 mm (image 48; series 6). Additionally, within the right peritracheal location there is prominent soft tissue (image 82; series 6) measuring 1.9 cm in diameter, potentially secondary to on opacified azygos. Lungs/Pleura: Central airways are patent. Moderate layering right pleural effusion. Subpleural consolidation within the right lower lobe. Patchy consolidation within the lingula. No pneumothorax. Upper Abdomen: No acute process. Musculoskeletal: Thoracic spine degenerative changes. No aggressive or acute appearing osseous lesions. Review of the MIP images confirms the above findings. IMPRESSION: Findings compatible with acute pulmonary embolus. There appears to be new thrombus within the distal right middle lobe pulmonary arteries and lingular pulmonary arteries. Minimal residual thrombus within the right lower lobe pulmonary arteries. Positive for acute PE with CT evidence of right heart strain (RV/LV Ratio = 1.1) consistent with at least submassive (intermediate risk) PE. The presence of right heart strain has been associated with an increased risk of morbidity and mortality. Please activate Code PE by paging [phone]. Moderate layering right pleural effusion. Subpleural consolidation within the right lower lobe as well as patchy consolidation within the lingula which may represent atelectasis or infection. Prominent mildly enlarged right peritracheal lymph nodes. Additionally there is prominent soft tissue at the expected location of the azygos, potentially representing non-opacified azygos. Lymph nodes may be reactive in etiology. Given multitude of pulmonary findings, recommend follow-up chest CT in 2- 3 months to assess for interval resolution. Ascending thoracic aorta measures 4.3 cm. Recommend annual imaging followup by CTA  or MRA. This recommendation follows 2010 ACCF/AHA/AATS/ACR/ASA/SCA/SCAI/SIR/STS/SVM Guidelines for the Diagnosis and Management of Patients with Thoracic Aortic Disease. Circulation. 2010; 121: W295-A213 Critical Value/emergent results were called by telephone at the time of interpretation on 02/26/2017 at 10:41 am to Dr. Lynden Oxford , who verbally acknowledged these results. Electronically Signed   By: Annia Belt M.D.   On: 02/26/2017 10:52    Procedures Procedures (including critical care time)  CRITICAL CARE Performed by: Canary Brim Tegeler Total critical care time: 35 minutes Critical care time was exclusive of separately billable procedures and treating other patients. Critical care was necessary to treat or prevent imminent or life-threatening deterioration. Critical care was time spent personally by me on the following activities: development of treatment plan with patient and/or surrogate as well as nursing, discussions with consultants, evaluation of patient's response to treatment, examination of patient, obtaining history from patient or surrogate, ordering and performing treatments and interventions, ordering and review of laboratory studies, ordering and review of radiographic studies, pulse oximetry and re-evaluation of patient's condition.   Medications Ordered in ED Medications  methocarbamol (ROBAXIN) tablet 500 mg (not administered)  oxyCODONE-acetaminophen (PERCOCET/ROXICET) 5-325 MG per tablet 1-2 tablet (2 tablets Oral Given 02/26/17 1400)  sodium chloride flush (NS) 0.9 % injection 3  mL (not administered)  sodium chloride flush (NS) 0.9 % injection 3 mL (not administered)  0.9 %  sodium chloride infusion (not administered)  ibuprofen (ADVIL,MOTRIN) tablet 800 mg (not administered)  ketorolac (TORADOL) 30 MG/ML injection 30 mg (not administered)  docusate sodium (COLACE) capsule 100 mg (100 mg Oral Given 02/26/17 1508)  senna-docusate (Senokot-S) tablet 1 tablet  (not administered)  bisacodyl (DULCOLAX) suppository 10 mg (not administered)  sodium phosphate (FLEET) 7-19 GM/118ML enema 1 enema (not administered)  cefTRIAXone (ROCEPHIN) 1 g in dextrose 5 % 50 mL IVPB (1 g Intravenous Given 02/26/17 1434)  azithromycin (ZITHROMAX) 500 mg in dextrose 5 % 250 mL IVPB (500 mg Intravenous Given 02/26/17 1508)  enoxaparin (LOVENOX) injection 100 mg (not administered)  HYDROmorphone (DILAUDID) injection 1 mg (1 mg Intravenous Given 02/26/17 0834)  iopamidol (ISOVUE-370) 76 % injection (100 mLs Intravenous Contrast Given 02/26/17 0949)  enoxaparin (LOVENOX) injection 100 mg (100 mg Subcutaneous Given 02/26/17 1313)     Initial Impression / Assessment and Plan / ED Course  I have reviewed the triage vital signs and the nursing notes.  Pertinent labs & imaging results that were available during my care of the patient were reviewed by me and considered in my medical decision making (see chart for details).     Eric Myers is a 56 y.o. male with a past medical history significant for diagnosis of pulmonary embolism 4 days ago on several toe and recent pneumonia Levaquin who presents with worsening chest pain and shortness of breath.   history and exam are seen above.  On exam, Patient's chest is nontender. Right lung has rhonchi and crackles. Abdomen nontender. Symmetric pulses in all extremities. No lower extremity tenderness or swelling.  Based on patient's report of current pneumonia and worsening shortness of breath with fever, patient with x-ray to look for worsening pneumonia. Given his recent diagnosis of pulmonary embolism and recurrent and worsening chest pain with shortness of breath, patient will have CT PE study to look for new clot burden. Patient will also have screening laboratory testing.  EKG showed no STEMI or other signs of acute ischemia. Patient does have an S1,T3 pattern.    11:49 AM Pulmonary team was called. They are recommending  restarting Lovenox and obtaining bilateral lower extremity DVT studies. They agree with admission to the hospitalist service and monitoring. They feel that the patient needs to have the ultrasounds before declaring the patient a failure of Xarelto.   [place] ordered.   Pt admitted in stable condition.    Final Clinical Impressions(s) / ED Diagnoses   Final diagnoses:  Other acute pulmonary embolism without acute cor pulmonale (HCC)  Precordial pain  Shortness of breath    Clinical Impression: 1. Other acute pulmonary embolism without acute cor pulmonale (HCC)   2. Precordial pain   3. Shortness of breath     Disposition: Admit to Hospitalist service    Heide Scales, MD 02/26/17 1750    Canary Brim Tegeler, MD 02/26/17 1610

## 2017-02-27 ENCOUNTER — Observation Stay (HOSPITAL_COMMUNITY): Payer: Managed Care, Other (non HMO)

## 2017-02-27 DIAGNOSIS — Z86711 Personal history of pulmonary embolism: Secondary | ICD-10-CM | POA: Diagnosis not present

## 2017-02-27 DIAGNOSIS — I2602 Saddle embolus of pulmonary artery with acute cor pulmonale: Secondary | ICD-10-CM | POA: Diagnosis not present

## 2017-02-27 DIAGNOSIS — J181 Lobar pneumonia, unspecified organism: Secondary | ICD-10-CM

## 2017-02-27 DIAGNOSIS — I2699 Other pulmonary embolism without acute cor pulmonale: Secondary | ICD-10-CM

## 2017-02-27 DIAGNOSIS — J189 Pneumonia, unspecified organism: Secondary | ICD-10-CM | POA: Diagnosis present

## 2017-02-27 DIAGNOSIS — R079 Chest pain, unspecified: Secondary | ICD-10-CM | POA: Diagnosis present

## 2017-02-27 LAB — CBC
HCT: 41.7 % (ref 39.0–52.0)
Hemoglobin: 14 g/dL (ref 13.0–17.0)
MCH: 28.2 pg (ref 26.0–34.0)
MCHC: 33.6 g/dL (ref 30.0–36.0)
MCV: 83.9 fL (ref 78.0–100.0)
PLATELETS: 250 10*3/uL (ref 150–400)
RBC: 4.97 MIL/uL (ref 4.22–5.81)
RDW: 12.9 % (ref 11.5–15.5)
WBC: 9.2 10*3/uL (ref 4.0–10.5)

## 2017-02-27 LAB — COMPREHENSIVE METABOLIC PANEL
ALT: 36 U/L (ref 17–63)
AST: 39 U/L (ref 15–41)
Albumin: 3.1 g/dL — ABNORMAL LOW (ref 3.5–5.0)
Alkaline Phosphatase: 53 U/L (ref 38–126)
Anion gap: 10 (ref 5–15)
BUN: 21 mg/dL — AB (ref 6–20)
CALCIUM: 9.1 mg/dL (ref 8.9–10.3)
CHLORIDE: 98 mmol/L — AB (ref 101–111)
CO2: 25 mmol/L (ref 22–32)
CREATININE: 0.95 mg/dL (ref 0.61–1.24)
Glucose, Bld: 116 mg/dL — ABNORMAL HIGH (ref 65–99)
Potassium: 4 mmol/L (ref 3.5–5.1)
SODIUM: 133 mmol/L — AB (ref 135–145)
Total Bilirubin: 0.8 mg/dL (ref 0.3–1.2)
Total Protein: 6.6 g/dL (ref 6.5–8.1)

## 2017-02-27 LAB — PROTIME-INR
INR: 1.13
PROTHROMBIN TIME: 14.6 s (ref 11.4–15.2)

## 2017-02-27 LAB — APTT: aPTT: 47 seconds — ABNORMAL HIGH (ref 24–36)

## 2017-02-27 LAB — HIV ANTIBODY (ROUTINE TESTING W REFLEX): HIV SCREEN 4TH GENERATION: NONREACTIVE

## 2017-02-27 NOTE — Progress Notes (Signed)
PROGRESS NOTE        PATIENT DETAILS Name: Eric Myers Age: 56 y.o. Sex: male Date of Birth: Mar 09, 1961 Admit Date: 02/26/2017 Admitting Physician Pete Glatter, MD ZOX:[id number] [name], MD  Brief Narrative: Patient is a 56 y.o. male with recent history of right shoulder surgery approximately 2 weeks back, subsequently, diagnosed with a pulmonary embolism last week and started on Xarelto. He was just recently discharged from [facility], he was doing well for 1-2 days, he then started developing worsening chest pain and shortness of breath, he then presented to the ED where a CT angiogram of the chest showed new pulmonary embolism in the right middle lobe pulmonary artery and lingular arteries. He was subsequently started on Lovenox-Xarelto was discontinued, and was admitted for further evaluation and treatment. See below for further details   Subjective: No chest pain-lying comfortably in bed. Does not appear to be short of breath. Spouse at bedside.  Assessment/Plan: Acute Pulmonary embolism: Suspect this to be provoked, given recent surgery. He was just recently discharged from [facility] a few days back on Xarelto, a CT angiogram of the chest done on 3/24,  demonstrates new areas of pulmonary embolism. Per family, a lower extremity Doppler done at [facility] was negative. I suspect that he could have had DVT in his iliac veins/IVC, that could have embolized. For now I would continue empiric Lovenox treatment, will check lower extremity Dopplers including iliac/IVC, we will also check a upper extremity Doppler. Echocardiogram is also pending-but he is hemodynamically stable, with no acute features suggestive of RV strain. Once we have all his workup complete, we will then decide regarding long-term anticoagulation. Patient and Family aware that they will need age-appropriate cancer screening completed as an outpatient, and will need referral  to hematology for hypercoagulable workup before long-term anticoagulation has been discontinued.  Pneumonia: Not sure if this is pneumonia or a lung infarction. In the meantime, continue with empiric antimicrobial therapy. He appears afebrile and nontoxic.  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  Mildly enlarged right paratracheal lymph node: Will require repeat imaging in the next 2-3 months. This will also be deferred to his primary care practitioner.  DVT Prophylaxis: Full dose anticoagulation with Lovenox  Code Status: Full code  Family Communication: Spouse at bedside  Disposition Plan: Remain inpatient-home in the next few days  Antimicrobial agents: Anti-infectives    Start     Dose/Rate Route Frequency Ordered Stop   02/26/17 1230  cefTRIAXone (ROCEPHIN) 1 g in dextrose 5 % 50 mL IVPB     1 g 100 mL/hr over 30 Minutes Intravenous Every 24 hours 02/26/17 1217 03/05/17 1229   02/26/17 1230  azithromycin (ZITHROMAX) 500 mg in dextrose 5 % 250 mL IVPB     500 mg 250 mL/hr over 60 Minutes Intravenous Every 24 hours 02/26/17 1217 03/05/17 1229      Procedures: None  CONSULTS:  None  Time spent: 25 minutes-Greater than 50% of this time was spent in counseling, explanation of diagnosis, planning of further management, and coordination of care.  MEDICATIONS: Scheduled Meds: . azithromycin  500 mg Intravenous Q24H  . cefTRIAXone (ROCEPHIN)  IV  1 g Intravenous Q24H  . docusate sodium  100 mg Oral BID  . enoxaparin (LOVENOX) injection  100 mg  Subcutaneous Q12H  . sodium chloride flush  3 mL Intravenous Q12H   Continuous Infusions: PRN Meds:.sodium chloride, acetaminophen, bisacodyl, ibuprofen, methocarbamol, oxyCODONE-acetaminophen, senna-docusate, sodium chloride flush, sodium phosphate   PHYSICAL EXAM: Vital signs: Vitals:   02/26/17 1333 02/26/17 1340 02/26/17 2052 02/27/17 0500  BP:   114/76 123/70 127/71  Pulse:  89 91 70  Resp:  20 18 13   Temp:  98.6 F (37 C) 98.9 F (37.2 C) 97.8 F (36.6 C)  TempSrc:  Oral    SpO2:  98% 100% 100%  Weight: 100.3 kg (221 lb 3.2 oz)     Height: 6\' 1"  (1.854 m)      Filed Weights   02/26/17 0655 02/26/17 1333  Weight: 98.9 kg (218 lb) 100.3 kg (221 lb 3.2 oz)   Body mass index is 29.18 kg/m.   General appearance :Awake, alert, not in any distress. Speech Clear.  Eyes:, pupils equally reactive to light and accomodation,no scleral icterus. HEENT: Atraumatic and Normocephalic Neck: supple, no JVD. No cervical lymphadenopathy. Resp:Good air entry bilaterally, no added sounds  CVS: S1 S2 regular, no murmurs.  GI: Bowel sounds present, Non tender and not distended with no gaurding, rigidity or rebound.No organomegaly Extremities: B/L Lower Ext shows no edema, both legs are warm to touch Neurology:  speech clear,Non focal, sensation is grossly intact. Psychiatric: Normal judgment and insight. Alert and oriented x 3. Normal mood. Musculoskeletal:No digital cyanosis Skin:No Rash, warm and dry Wounds:N/A  I have personally reviewed following labs and imaging studies  LABORATORY DATA: CBC:  Recent Labs Lab 02/26/17 0715 02/27/17 0246  WBC 10.3 9.2  HGB 14.5 14.0  HCT 43.3 41.7  MCV 83.6 83.9  PLT 233 250    Basic Metabolic Panel:  Recent Labs Lab 02/26/17 0715 02/27/17 0246  NA 134* 133*  K 4.1 4.0  CL 101 98*  CO2 21* 25  GLUCOSE 131* 116*  BUN 18 21*  CREATININE 1.05 0.95  CALCIUM 9.3 9.1    GFR: Estimated Creatinine Clearance: 112.1 mL/min (by C-G formula based on SCr of 0.95 mg/dL).  Liver Function Tests:  Recent Labs Lab 02/27/17 0246  AST 39  ALT 36  ALKPHOS 53  BILITOT 0.8  PROT 6.6  ALBUMIN 3.1*   No results for input(s): LIPASE, AMYLASE in the last 168 hours. No results for input(s): AMMONIA in the last 168 hours.  Coagulation Profile:  Recent Labs Lab 02/26/17 0715  02/27/17 0246  INR 1.42 1.13    Cardiac Enzymes: No results for input(s): CKTOTAL, CKMB, CKMBINDEX, TROPONINI in the last 168 hours.  BNP (last 3 results) No results for input(s): PROBNP in the last 8760 hours.  HbA1C: No results for input(s): HGBA1C in the last 72 hours.  CBG: No results for input(s): GLUCAP in the last 168 hours.  Lipid Profile: No results for input(s): CHOL, HDL, LDLCALC, TRIG, CHOLHDL, LDLDIRECT in the last 72 hours.  Thyroid Function Tests: No results for input(s): TSH, T4TOTAL, FREET4, T3FREE, THYROIDAB in the last 72 hours.  Anemia Panel: No results for input(s): VITAMINB12, FOLATE, FERRITIN, TIBC, IRON, RETICCTPCT in the last 72 hours.  Urine analysis:    Component Value Date/Time   COLORURINE YELLOW 02/26/2017 0900   APPEARANCEUR CLEAR 02/26/2017 0900   LABSPEC 1.024 02/26/2017 0900   PHURINE 5.0 02/26/2017 0900   GLUCOSEU NEGATIVE 02/26/2017 0900   HGBUR NEGATIVE 02/26/2017 0900   BILIRUBINUR NEGATIVE 02/26/2017 0900   KETONESUR NEGATIVE 02/26/2017 0900   PROTEINUR NEGATIVE 02/26/2017 0900  NITRITE NEGATIVE 02/26/2017 0900   LEUKOCYTESUR NEGATIVE 02/26/2017 0900    Sepsis Labs: Lactic Acid, Venous No results found for: LATICACIDVEN  MICROBIOLOGY: No results found for this or any previous visit (from the past 240 hour(s)).  RADIOLOGY STUDIES/RESULTS: Dg Chest 2 View  Result Date: 02/26/2017 CLINICAL DATA:  Shortness of breath and chest pain EXAM: CHEST  2 VIEW COMPARISON:  None. FINDINGS: There is atelectatic change in the lateral left base. Lungs elsewhere are clear. Heart size and pulmonary vascularity are normal. No adenopathy. No bone lesions. IMPRESSION: Atelectasis lateral left base. Lungs elsewhere clear. Cardiac silhouette within normal limits. Electronically Signed   By: Bretta BangWilliam  Woodruff III M.D.   On: 02/26/2017 07:54   Ct Angio Chest Pe W And/or Wo Contrast  Result Date: 02/26/2017 CLINICAL DATA:  Patient with history of  pneumonia right-sided pulmonary embolus. Worsening right-sided chest pain. EXAM: CT ANGIOGRAPHY CHEST WITH CONTRAST TECHNIQUE: Multidetector CT imaging of the chest was performed using the standard protocol during bolus administration of intravenous contrast. Multiplanar CT image reconstructions and MIPs were obtained to evaluate the vascular anatomy. CONTRAST:  100 cc Isovue 370 COMPARISON:  Chest radiograph 02/26/2017 PE study 02/22/2017 FINDINGS: Cardiovascular: Adequate opacification of the pulmonary arterial system. Filling defects are identified within the right middle and lower lobe lobar and segmental pulmonary arteries. Filling defect within the right middle lobe is new from prior. Additionally new filling defects are demonstrated within the left upper lobe segmental pulmonary arteries. RV/LV ratio of 1.1. Normal heart size. No pericardial effusion. Ascending thoracic aorta measures 4.3 cm. Main pulmonary artery upper limits normal measuring 3.0 cm. Mediastinum/Nodes: Esophagus is unremarkable. Prominent and mildly enlarged right peritracheal lymph nodes measuring up to 11 mm (image 48; series 6). Additionally, within the right peritracheal location there is prominent soft tissue (image 82; series 6) measuring 1.9 cm in diameter, potentially secondary to on opacified azygos. Lungs/Pleura: Central airways are patent. Moderate layering right pleural effusion. Subpleural consolidation within the right lower lobe. Patchy consolidation within the lingula. No pneumothorax. Upper Abdomen: No acute process. Musculoskeletal: Thoracic spine degenerative changes. No aggressive or acute appearing osseous lesions. Review of the MIP images confirms the above findings. IMPRESSION: Findings compatible with acute pulmonary embolus. There appears to be new thrombus within the distal right middle lobe pulmonary arteries and lingular pulmonary arteries. Minimal residual thrombus within the right lower lobe pulmonary arteries.  Positive for acute PE with CT evidence of right heart strain (RV/LV Ratio = 1.1) consistent with at least submassive (intermediate risk) PE. The presence of right heart strain has been associated with an increased risk of morbidity and mortality. Please activate Code PE by paging [phone]. Moderate layering right pleural effusion. Subpleural consolidation within the right lower lobe as well as patchy consolidation within the lingula which may represent atelectasis or infection. Prominent mildly enlarged right peritracheal lymph nodes. Additionally there is prominent soft tissue at the expected location of the azygos, potentially representing non-opacified azygos. Lymph nodes may be reactive in etiology. Given multitude of pulmonary findings, recommend follow-up chest CT in 2- 3 months to assess for interval resolution. Ascending thoracic aorta measures 4.3 cm. Recommend annual imaging followup by CTA or MRA. This recommendation follows 2010 ACCF/AHA/AATS/ACR/ASA/SCA/SCAI/SIR/STS/SVM Guidelines for the Diagnosis and Management of Patients with Thoracic Aortic Disease. Circulation. 2010; 121: U981-X914: [id number] Critical Value/emergent results were called by telephone at the time of interpretation on 02/26/2017 at 10:41 am to Dr. Lynden OxfordHRISTOPHER TEGELER , who verbally acknowledged these results. Electronically Signed   By:  Annia Belt M.D.   On: 02/26/2017 10:52     LOS: 0 days   Jeoffrey Massed, MD  [facility] Pager:[phone]  If 7PM-7AM, please contact night-coverage [email or web address] Password [facility] 02/27/2017, 10:56 AM

## 2017-02-28 ENCOUNTER — Inpatient Hospital Stay (HOSPITAL_COMMUNITY): Payer: Managed Care, Other (non HMO)

## 2017-02-28 DIAGNOSIS — Z86711 Personal history of pulmonary embolism: Secondary | ICD-10-CM

## 2017-02-28 DIAGNOSIS — I2699 Other pulmonary embolism without acute cor pulmonale: Secondary | ICD-10-CM

## 2017-02-28 LAB — ECHOCARDIOGRAM COMPLETE
Height: 73 in
WEIGHTICAEL: 3539.2 [oz_av]

## 2017-02-28 LAB — LEGIONELLA PNEUMOPHILA SEROGP 1 UR AG: L. pneumophila Serogp 1 Ur Ag: NEGATIVE

## 2017-02-28 MED ORDER — AZITHROMYCIN 250 MG PO TABS
500.0000 mg | ORAL_TABLET | Freq: Every day | ORAL | Status: DC
  Administered 2017-02-28: 500 mg via ORAL
  Filled 2017-02-28: qty 2

## 2017-02-28 MED ORDER — ENOXAPARIN SODIUM 150 MG/ML ~~LOC~~ SOLN: 1.5000 mg/kg | SUBCUTANEOUS | Status: DC

## 2017-02-28 MED ORDER — OXYCODONE-ACETAMINOPHEN 5-325 MG PO TABS: 1.0000 | ORAL_TABLET | Freq: Three times a day (TID) | ORAL | 0 refills | Status: DC | PRN

## 2017-02-28 MED ORDER — ENOXAPARIN (LOVENOX) PATIENT EDUCATION KIT
PACK | Freq: Once | Status: AC
  Administered 2017-02-28: 15:00:00
  Filled 2017-02-28: qty 1

## 2017-02-28 MED ORDER — ENOXAPARIN SODIUM 150 MG/ML ~~LOC~~ SOLN: 1.5000 mg/kg | SUBCUTANEOUS | 0 refills | Status: DC

## 2017-02-28 MED FILL — OXYCODONE W/APAP 5/325 TAB: 5-325 | 5 days supply | Qty: 15 | Fill #0

## 2017-02-28 MED FILL — ENOXAPARIN 150 MG/ML SYRN: 150 | 14 days supply | Qty: 14 | Fill #0

## 2017-02-28 NOTE — Discharge Summary (Addendum)
PATIENT DETAILS Name: Eric Myers Age: 56 y.o. Sex: male Date of Birth: Nov 17, 1961 MRN: [id number]. Admitting Physician: Pete Glatter, MD WUJ:[id number] Eric Myers., MD  Admit Date: 02/26/2017 Discharge date: 02/28/2017  Recommendations for Outpatient Follow-up:  1. Follow up with PCP in 1-2 weeks 2. Please obtain BMP/CBC in one week 3. Please ensure follow up with Hematology at [facility] 4. Please ensure age appropriate cancer screening. 5. 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 Needs repeat CT Chest in 2-3 months to assess mildly enlarged right paratracheal lymph node  Admitted From:  Home  Disposition: Home   Home Health: No  Equipment/Devices: None  Discharge Condition: Stable  CODE STATUS: FULL CODE  Diet recommendation:  Heart Healthy   Brief Summary: See H&P, Labs, Consult and Test reports for all details in brief, Patient is a 56 y.o. male with recent history of right shoulder surgery approximately 2 weeks back, subsequently, diagnosed with a pulmonary embolism last week and started on Xarelto. He was just recently discharged from [facility], he was doing well for 1-2 days, he then started developing worsening chest pain and shortness of breath, he then presented to the ED where a CT angiogram of the chest showed new pulmonary embolism in the right middle lobe pulmonary artery and lingular arteries. He was subsequently started on Lovenox-Xarelto was discontinued, and was admitted for further evaluation and treatment. See below for further details   Brief Hospital Course: Acute Pulmonary embolism: Suspect this to be provoked, given recent surgery. He was just recently discharged from [facility] a few days back on Xarelto, a CT angiogram of the chest done on 3/24 on admission to [place],demonstrated new areas of pulmonary embolism when compared to a CT Scan done at [facility] a few days  back.  Per family, a lower extremity Doppler done at [facility] was negative. Patient claims to have been compliant to Xarelto that he was discharged on from [facility]. I suspect that he could have had DVT in his iliac veins/IVC, that could have embolized.Repeat Doppler of lower and upper extremity done on 3/26 are negative, not sure if this is Xarelto failure. Echo does not show RV strain. Patient has clinically improved, he is now ambulating in the hallway without chest pain or SOB. Spoke with Dr Cyndie Chime (Hematology) over the phone, case discussed in detail, he recommends we continue SQ Lovenox for 1-2 weeks, and then consider switching him to either coumadin or Pradaxa in 1-2 weeks time. I have reached out the Oncology group at [facility] to see if we can arrange follow up at [facility].As requested, we have faxed to over facesheet and my progress note from today. Patient and family aware that they will need age-appropriate cancer screening completed as an outpatient, and will need referral to hematology for hypercoagulable workup before long-term anticoagulation has been discontinued.  Note-Lower ext Doppler showed dilated left gastrocnemius vein with remarkable sluggish flow. Spoke with Dr Geralyn Corwin findings with him, he thought that this was non specific.Also, discussed with Dr Arbie Cookey this unusual case of recurrent PE-he advised no role for IVC filter in this setting.   Note-spouse (works at urgent care) and patient feel comfortable injecting SQ Lovenox. They want to go home today. RN will do teaching prior to discharge.  Pneumonia: Not sure if this is pneumonia or a lung infarction. He was maintained on empiric antimicrobial therapy. He appears afebrile and nontoxic.He will complete  Levaquin course that he was discharged from-recently from [facility].  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  Mildly enlarged right paratracheal lymph node: Will require repeat imaging in the next 2-3 months. This will also be deferred to his primary care practitioner.  Procedures/Studies: Echo 3/25>> - Left ventricle: The cavity size was normal. There was mild   concentric hypertrophy. Systolic function was normal. The   estimated ejection fraction was in the range of 55% to 60%. Wall   motion was normal; there were no regional wall motion   abnormalities. Doppler parameters are consistent with abnormal   left ventricular relaxation (grade 1 diastolic dysfunction).   Doppler parameters are consistent with indeterminate ventricular   filling pressure. - Aortic valve: Transvalvular velocity was within the normal range.   There was no stenosis. There was no regurgitation. Valve area   (VTI): 2.44 cm^2. Valve area (Vmax): 2.42 cm^2. Valve area   (Vmean): 2.32 cm^2. - Mitral valve: There was trivial regurgitation. - Right ventricle: The cavity size was mildly dilated. Wall   thickness was normal. Systolic function was normal. - Tricuspid valve: There was trivial regurgitation. - Pulmonary arteries: Systolic pressure was within the normal   range. PA peak pressure: 17 mm Hg (S).  Discharge Diagnoses:  Principal Problem:   Acute pulmonary embolism (HCC) Active Problems:   Pleuritic chest pain   RLL pneumonia [facility])  Discharge Instructions:  Activity:  As tolerated with Full fall precautions use walker/cane & assistance as needed  Discharge Instructions    Call MD for:  difficulty breathing, headache or visual disturbances    Complete by:  As directed    Call MD for:  severe uncontrolled pain    Complete by:  As directed    Diet general    Complete by:  As directed    Discharge instructions    Complete by:  As directed    Follow with Primary MD  [facility] Eric Myers., MD and Dr Rella Larve in 1-2 weeks  Your have a mildly enlarged lymph node in your  chest-please ask your PCP to do a repeat CT Chest in 2-3 months  You have a mildly enlarged ascending aorta (aneurysm)-you will require yearly surveillance with repeat CT Scans-please let your PCP know  Please get a complete blood count and chemistry panel checked by your Primary MD at your next visit, and again as instructed by your Primary MD.  Get Medicines reviewed and adjusted: Please take all your medications with you for your next visit with your Primary MD  Laboratory/radiological data: Please request your Primary MD to go over all hospital tests and procedure/radiological results at the follow up, please ask your Primary MD to get all Hospital records sent to his/her office.  In some cases, they will be blood work, cultures and biopsy results pending at the time of your discharge. Please request that your primary care M.D. follows up on these results.  Also Note the following: If you experience worsening of your admission symptoms, develop shortness of breath, life threatening emergency, suicidal or homicidal thoughts you must seek medical attention immediately by calling 911 or calling your MD immediately  if symptoms less severe.  You must read complete instructions/literature along with all the possible adverse reactions/side effects for all the Medicines you take and that have been prescribed to you. Take any new Medicines after you have completely understood and accpet all the possible adverse reactions/side  effects.   Do not drive when taking Pain medications or sleeping medications (Benzodaizepines)  Do not take more than prescribed Pain, Sleep and Anxiety Medications. It is not advisable to combine anxiety,sleep and pain medications without talking with your primary care practitioner  Special Instructions: If you have smoked or chewed Tobacco  in the last 2 yrs please stop smoking, stop any regular Alcohol  and or any Recreational drug use.  Wear Seat belts while  driving.  Please note: You were cared for by a hospitalist during your hospital stay. Once you are discharged, your primary care physician will handle any further medical issues. Please note that NO REFILLS for any discharge medications will be authorized once you are discharged, as it is imperative that you return to your primary care physician (or establish a relationship with a primary care physician if you do not have one) for your post hospital discharge needs so that they can reassess your need for medications and monitor your lab values.   Increase activity slowly    Complete by:  As directed      Allergies as of 02/28/2017      Reactions   Morphine And Related Itching, Other (See Comments)   Rashy red areas and doesn't help pain      Medication List    STOP taking these medications   Rivaroxaban 15 MG Tabs tablet Commonly known as:  XARELTO     TAKE these medications   enoxaparin 150 MG/ML injection Commonly known as:  LOVENOX Inject 1 mL (150 mg total) into the skin daily. Start taking on:  03/01/2017   levofloxacin 750 MG tablet Commonly known as:  LEVAQUIN Take 750 mg by mouth daily. For 10 days   methocarbamol 500 MG tablet Commonly known as:  ROBAXIN Take 500 mg by mouth every 6 (six) hours as needed for muscle spasms.   oxyCODONE-acetaminophen 5-325 MG tablet Commonly known as:  PERCOCET/ROXICET Take 1 tablet by mouth every 8 (eight) hours as needed for severe pain. What changed:  how much to take  when to take this      Follow-up Information    LEWIS,DEQUINCY A., MD Follow up.   Specialty:  Oncology Why:  office will call you for a appointment-if you do not hear from them, please give them a call. Contact information: [address] [place] [address] [phone]        [facility] Eric Myers., MD. Schedule an appointment as soon as possible for a visit in 1 week(s).   Specialty:  Family Medicine Contact information: [address]  [place] [address] [phone]          Allergies  Allergen Reactions  . Morphine And Related Itching and Other (See Comments)    Rashy red areas and doesn't help pain    Consultations:   None   Other Procedures/Studies: Dg Chest 2 View  Result Date: 02/26/2017 CLINICAL DATA:  Shortness of breath and chest pain EXAM: CHEST  2 VIEW COMPARISON:  None. FINDINGS: There is atelectatic change in the lateral left base. Lungs elsewhere are clear. Heart size and pulmonary vascularity are normal. No adenopathy. No bone lesions. IMPRESSION: Atelectasis lateral left base. Lungs elsewhere clear. Cardiac silhouette within normal limits. Electronically Signed   By: Bretta Bang III M.D.   On: 02/26/2017 07:54   Ct Angio Chest Pe W And/or Wo Contrast  Result Date: 02/26/2017 CLINICAL DATA:  Patient with history of pneumonia right-sided pulmonary embolus. Worsening right-sided chest  pain. EXAM: CT ANGIOGRAPHY CHEST WITH CONTRAST TECHNIQUE: Multidetector CT imaging of the chest was performed using the standard protocol during bolus administration of intravenous contrast. Multiplanar CT image reconstructions and MIPs were obtained to evaluate the vascular anatomy. CONTRAST:  100 cc Isovue 370 COMPARISON:  Chest radiograph 02/26/2017 PE study 02/22/2017 FINDINGS: Cardiovascular: Adequate opacification of the pulmonary arterial system. Filling defects are identified within the right middle and lower lobe lobar and segmental pulmonary arteries. Filling defect within the right middle lobe is new from prior. Additionally new filling defects are demonstrated within the left upper lobe segmental pulmonary arteries. RV/LV ratio of 1.1. Normal heart size. No pericardial effusion. Ascending thoracic aorta measures 4.3 cm. Main pulmonary artery upper limits normal measuring 3.0 cm. Mediastinum/Nodes: Esophagus is unremarkable. Prominent and mildly enlarged right peritracheal lymph nodes measuring up to 11  mm (image 48; series 6). Additionally, within the right peritracheal location there is prominent soft tissue (image 82; series 6) measuring 1.9 cm in diameter, potentially secondary to on opacified azygos. Lungs/Pleura: Central airways are patent. Moderate layering right pleural effusion. Subpleural consolidation within the right lower lobe. Patchy consolidation within the lingula. No pneumothorax. Upper Abdomen: No acute process. Musculoskeletal: Thoracic spine degenerative changes. No aggressive or acute appearing osseous lesions. Review of the MIP images confirms the above findings. IMPRESSION: Findings compatible with acute pulmonary embolus. There appears to be new thrombus within the distal right middle lobe pulmonary arteries and lingular pulmonary arteries. Minimal residual thrombus within the right lower lobe pulmonary arteries. Positive for acute PE with CT evidence of right heart strain (RV/LV Ratio = 1.1) consistent with at least submassive (intermediate risk) PE. The presence of right heart strain has been associated with an increased risk of morbidity and mortality. Please activate Code PE by paging [phone]. Moderate layering right pleural effusion. Subpleural consolidation within the right lower lobe as well as patchy consolidation within the lingula which may represent atelectasis or infection. Prominent mildly enlarged right peritracheal lymph nodes. Additionally there is prominent soft tissue at the expected location of the azygos, potentially representing non-opacified azygos. Lymph nodes may be reactive in etiology. Given multitude of pulmonary findings, recommend follow-up chest CT in 2- 3 months to assess for interval resolution. Ascending thoracic aorta measures 4.3 cm. Recommend annual imaging followup by CTA or MRA. This recommendation follows 2010 ACCF/AHA/AATS/ACR/ASA/SCA/SCAI/SIR/STS/SVM Guidelines for the Diagnosis and Management of Patients with Thoracic Aortic Disease.  Circulation. 2010; 121: [id number] Critical Value/emergent results were called by telephone at the time of interpretation on 02/26/2017 at 10:41 am to Dr. Lynden Oxford , who verbally acknowledged these results. Electronically Signed   By: Annia Belt M.D.   On: 02/26/2017 10:52     TODAY-DAY OF DISCHARGE:  Subjective:   Eric Myers today has no headache,no chest abdominal pain,no new weakness tingling or numbness, feels much better wants to go home today.   Objective:   Blood pressure 134/84, pulse 72, temperature 98.6 F (37 C), temperature source Oral, resp. rate 15, height 6\' 1"  (1.854 m), weight 100 kg (220 lb 6.4 oz), SpO2 98 %.  Intake/Output Summary (Last 24 hours) at 02/28/17 1437 Last data filed at 02/28/17 0900  Gross per 24 hour  Intake              720 ml  Output             1100 ml  Net             -380 ml  Filed Weights   02/26/17 0655 02/26/17 1333 02/28/17 0331  Weight: 98.9 kg (218 lb) 100.3 kg (221 lb 3.2 oz) 100 kg (220 lb 6.4 oz)    Exam: Awake Alert, Oriented *3, No new F.N deficits, Normal affect [place].AT,PERRAL Supple Neck,No JVD, No cervical lymphadenopathy appriciated.  Symmetrical Chest wall movement, Good air movement bilaterally, CTAB RRR,No Gallops,Rubs or new Murmurs, No Parasternal Heave +ve B.Sounds, Abd Soft, Non tender, No organomegaly appriciated, No rebound -guarding or rigidity. No Cyanosis, Clubbing or edema, No new Rash or bruise   PERTINENT RADIOLOGIC STUDIES: Dg Chest 2 View  Result Date: 02/26/2017 CLINICAL DATA:  Shortness of breath and chest pain EXAM: CHEST  2 VIEW COMPARISON:  None. FINDINGS: There is atelectatic change in the lateral left base. Lungs elsewhere are clear. Heart size and pulmonary vascularity are normal. No adenopathy. No bone lesions. IMPRESSION: Atelectasis lateral left base. Lungs elsewhere clear. Cardiac silhouette within normal limits. Electronically Signed   By: Bretta Bang III M.D.   On:  02/26/2017 07:54   Ct Angio Chest Pe W And/or Wo Contrast  Result Date: 02/26/2017 CLINICAL DATA:  Patient with history of pneumonia right-sided pulmonary embolus. Worsening right-sided chest pain. EXAM: CT ANGIOGRAPHY CHEST WITH CONTRAST TECHNIQUE: Multidetector CT imaging of the chest was performed using the standard protocol during bolus administration of intravenous contrast. Multiplanar CT image reconstructions and MIPs were obtained to evaluate the vascular anatomy. CONTRAST:  100 cc Isovue 370 COMPARISON:  Chest radiograph 02/26/2017 PE study 02/22/2017 FINDINGS: Cardiovascular: Adequate opacification of the pulmonary arterial system. Filling defects are identified within the right middle and lower lobe lobar and segmental pulmonary arteries. Filling defect within the right middle lobe is new from prior. Additionally new filling defects are demonstrated within the left upper lobe segmental pulmonary arteries. RV/LV ratio of 1.1. Normal heart size. No pericardial effusion. Ascending thoracic aorta measures 4.3 cm. Main pulmonary artery upper limits normal measuring 3.0 cm. Mediastinum/Nodes: Esophagus is unremarkable. Prominent and mildly enlarged right peritracheal lymph nodes measuring up to 11 mm (image 48; series 6). Additionally, within the right peritracheal location there is prominent soft tissue (image 82; series 6) measuring 1.9 cm in diameter, potentially secondary to on opacified azygos. Lungs/Pleura: Central airways are patent. Moderate layering right pleural effusion. Subpleural consolidation within the right lower lobe. Patchy consolidation within the lingula. No pneumothorax. Upper Abdomen: No acute process. Musculoskeletal: Thoracic spine degenerative changes. No aggressive or acute appearing osseous lesions. Review of the MIP images confirms the above findings. IMPRESSION: Findings compatible with acute pulmonary embolus. There appears to be new thrombus within the distal right middle lobe  pulmonary arteries and lingular pulmonary arteries. Minimal residual thrombus within the right lower lobe pulmonary arteries. Positive for acute PE with CT evidence of right heart strain (RV/LV Ratio = 1.1) consistent with at least submassive (intermediate risk) PE. The presence of right heart strain has been associated with an increased risk of morbidity and mortality. Please activate Code PE by paging [phone]. Moderate layering right pleural effusion. Subpleural consolidation within the right lower lobe as well as patchy consolidation within the lingula which may represent atelectasis or infection. Prominent mildly enlarged right peritracheal lymph nodes. Additionally there is prominent soft tissue at the expected location of the azygos, potentially representing non-opacified azygos. Lymph nodes may be reactive in etiology. Given multitude of pulmonary findings, recommend follow-up chest CT in 2- 3 months to assess for interval resolution. Ascending thoracic aorta measures 4.3 cm. Recommend annual imaging followup by CTA  or MRA. This recommendation follows 2010 ACCF/AHA/AATS/ACR/ASA/SCA/SCAI/SIR/STS/SVM Guidelines for the Diagnosis and Management of Patients with Thoracic Aortic Disease. Circulation. 2010; 121: [id number] Critical Value/emergent results were called by telephone at the time of interpretation on 02/26/2017 at 10:41 am to Dr. Lynden Oxford , who verbally acknowledged these results. Electronically Signed   By: Annia Belt M.D.   On: 02/26/2017 10:52     PERTINENT LAB RESULTS: CBC:  Recent Labs  02/26/17 0715 02/27/17 0246  WBC 10.3 9.2  HGB 14.5 14.0  HCT 43.3 41.7  PLT 233 250   CMET CMP     Component Value Date/Time   NA 133 (L) 02/27/2017 0246   K 4.0 02/27/2017 0246   CL 98 (L) 02/27/2017 0246   CO2 25 02/27/2017 0246   GLUCOSE 116 (H) 02/27/2017 0246   BUN 21 (H) 02/27/2017 0246   CREATININE 0.95 02/27/2017 0246   CALCIUM 9.1 02/27/2017 0246   PROT 6.6  02/27/2017 0246   ALBUMIN 3.1 (L) 02/27/2017 0246   AST 39 02/27/2017 0246   ALT 36 02/27/2017 0246   ALKPHOS 53 02/27/2017 0246   BILITOT 0.8 02/27/2017 0246   GFRNONAA >60 02/27/2017 0246   GFRAA >60 02/27/2017 0246    GFR Estimated Creatinine Clearance: 111.8 mL/min (by C-G formula based on SCr of 0.95 mg/dL). No results for input(s): LIPASE, AMYLASE in the last 72 hours. No results for input(s): CKTOTAL, CKMB, CKMBINDEX, TROPONINI in the last 72 hours. Invalid input(s): POCBNP No results for input(s): DDIMER in the last 72 hours. No results for input(s): HGBA1C in the last 72 hours. No results for input(s): CHOL, HDL, LDLCALC, TRIG, CHOLHDL, LDLDIRECT in the last 72 hours. No results for input(s): TSH, T4TOTAL, T3FREE, THYROIDAB in the last 72 hours.  Invalid input(s): FREET3 No results for input(s): VITAMINB12, FOLATE, FERRITIN, TIBC, IRON, RETICCTPCT in the last 72 hours. Coags:  Recent Labs  02/26/17 0715 02/27/17 0246  INR 1.42 1.13   Microbiology: No results found for this or any previous visit (from the past 240 hour(s)).  FURTHER DISCHARGE INSTRUCTIONS:  Get Medicines reviewed and adjusted: Please take all your medications with you for your next visit with your Primary MD  Laboratory/radiological data: Please request your Primary MD to go over all hospital tests and procedure/radiological results at the follow up, please ask your Primary MD to get all Hospital records sent to his/her office.  In some cases, they will be blood work, cultures and biopsy results pending at the time of your discharge. Please request that your primary care M.D. goes through all the records of your hospital data and follows up on these results.  Also Note the following: If you experience worsening of your admission symptoms, develop shortness of breath, life threatening emergency, suicidal or homicidal thoughts you must seek medical attention immediately by calling 911 or calling your  MD immediately  if symptoms less severe.  You must read complete instructions/literature along with all the possible adverse reactions/side effects for all the Medicines you take and that have been prescribed to you. Take any new Medicines after you have completely understood and accpet all the possible adverse reactions/side effects.   Do not drive when taking Pain medications or sleeping medications (Benzodaizepines)  Do not take more than prescribed Pain, Sleep and Anxiety Medications. It is not advisable to combine anxiety,sleep and pain medications without talking with your primary care practitioner  Special Instructions: If you have smoked or chewed Tobacco  in the last 2 yrs please  stop smoking, stop any regular Alcohol  and or any Recreational drug use.  Wear Seat belts while driving.  Please note: You were cared for by a hospitalist during your hospital stay. Once you are discharged, your primary care physician will handle any further medical issues. Please note that NO REFILLS for any discharge medications will be authorized once you are discharged, as it is imperative that you return to your primary care physician (or establish a relationship with a primary care physician if you do not have one) for your post hospital discharge needs so that they can reassess your need for medications and monitor your lab values.  Total Time spent coordinating discharge including counseling, education and face to face time equals 45 minutes.  SignedJeoffrey Massed: Ahlam Piscitelli 02/28/2017 2:37 PM

## 2017-02-28 NOTE — Progress Notes (Signed)
VASCULAR LAB PRELIMINARY  PRELIMINARY  PRELIMINARY  PRELIMINARY  Bilateral lower extremity venous duplex completed.    Preliminary report:  There is no DVT or SVT noted in the bilateral lower extremities. The left gastrocnemius vein is significantly dilated with remarkable sluggish flow.  Eric Myers, RVT 02/28/2017, 10:36 AM

## 2017-02-28 NOTE — Care Management Note (Addendum)
Case Management Note  Patient Details  Name: Eric Myers MRN: [id number] Date of Birth: Apr 08, 1961  Subjective/Objective:   Pt presented for Acute Pulmonary Embolism. Pt was previously at [facility] and started on Xarelto. Pt is from home with wife.                Action/Plan: No needs identified by CM at this time.   Expected Discharge Date:                  Expected Discharge Plan:  Home/Self Care  In-House Referral:  NA  Discharge planning Services  CM Consult  Post Acute Care Choice:  NA Choice offered to:  NA  DME Arranged:  N/A DME Agency:  NA  HH Arranged:  NA HH Agency:  NA  Status of Service:  Completed, signed off  If discussed at Long Length of Stay Meetings, dates discussed:    Additional Comments: 1508 02-28-17 Eric BambergerBrenda Graves-Bigelow, RN,BSN [phone] Pt will have a co pay of $10.00. Pt is aware of cost and [facility] [place] has generic medication available. No further needs from CM at this time.  Eric Myers, Eric Goodgame [name], RN 02/28/2017, 12:18 PM

## 2017-02-28 NOTE — Progress Notes (Signed)
Preliminary results by tech - The IVC/iliac veins are patent without evidence of thrombus.  Marilynne Halstedita Winona Sison, BS, RDMS, RVT

## 2017-02-28 NOTE — Discharge Instructions (Signed)
How and Where to Give Subcutaneous Enoxaparin Injections °Enoxaparin is an injectable medicine. It is used to help prevent blood clots from developing in your veins. Health care providers often use anticoagulants like enoxaparin to prevent clots following surgery. Enoxaparin is also used in combination with other medicines to treat blood clots and heart attacks. If blood clots are left untreated, they can be life threatening. °Enoxaparin comes in single-use syringes. You inject enoxaparin through a syringe into your belly (abdomen). You should change the injection site each time you give yourself a shot. Continue the enoxaparin injections as directed by your health care provider. Your health care provider will use blood clotting test results to decide when you can safely stop using enoxaparin injections. If your health care provider prescribes any additional medicines, use the medicines exactly as directed. °How do I inject enoxaparin? °1. Wash your hands with soap and water. °2. Clean the selected injection site as directed by your health care provider. °3. Remove the needle cap by pulling it straight off the syringe. °4. When using a prefilled syringe, do not push the air bubble out of the syringe before the injection. The air bubble will help you get all of the medicine out of the syringe. °5. Hold the syringe like a pencil using your writing hand. °6. Use your other hand to pinch and hold an inch of the cleansed skin. °7. Insert the entire needle straight down into the fold of skin. °8. Push the plunger with your thumb until the syringe is empty. °9. Pull the needle straight out of your skin. °10. Enoxaparin injection prefilled syringes and graduated prefilled syringes are available with a system that shields the needle after injection. After you have completed your injection and removed the needle from your skin, firmly push down on the plunger. The protective sleeve will automatically cover the needle and you  will hear a click. The click means the needle is safely covered. °11. Place the syringe in the nearest needle box, also called a sharps container. If you do not have a sharps container, you can use a hard-sided plastic container with a secure lid, such as an empty laundry detergent bottle. °What else do I need to know? °· Do not use enoxaparin if: °¨ You have allergies to heparin or pork products. °¨ You have been diagnosed with a condition called thrombocytopenia. °· Do not use the syringe or needle more than one time. °· Use medicines only as directed by your health care provider. °· Changes in medicines, supplements, diet, and illness can affect your anticoagulation therapy. Be sure to inform your health care provider of any of these changes. °· It is important that you tell all of your health care providers and your dentist that you are taking an anticoagulant, especially if you are injured or plan to have any type of procedure. °· While on anticoagulants, you will need to have blood tests done routinely as directed by your health care provider. °· While using this medicine, avoid physical activities or sports that could result in a fall or cause injury. °· Follow up with your laboratory test and health care provider appointments as directed. It is very important to keep your appointments. Not keeping appointments could result in a chronic or permanent injury, pain, or disability. °· Before giving your medicine, you should make sure the injection is a clear and colorless or pale yellow solution. If your medicine becomes discolored or if there are particles in the syringe, do not use   it and notify your health care provider. °· Keep your medicine safely stored at room temperature. °Contact a health care provider if: °· You develop any rashes on your skin. °· You have large areas of bruising on your skin. °· You have any worsening of the condition for which you take Enoxaparin. °· You develop a fever. °Get help  right away if: °· You develop bleeding problems such as: °¨ Bleeding from the gums or nose that does not stop quickly. °¨ Vomiting blood or coughing up blood. °¨ Blood in your urine. °¨ Blood in your stool, or stool that has a dark, tarry, or coffee grounds appearance. °¨ A cut that does not stop bleeding within 10 minutes. °These symptoms may represent a serious problem that is an emergency. Do not wait to see if the symptoms will go away. Get medical help right away. Call your local emergency services (911 in the [place] Do not drive yourself to the hospital.  °This information is not intended to replace advice given to you by your health care provider. Make sure you discuss any questions you have with your health care provider. °Document Released: 09/23/2004 Document Revised: 07/29/2016 Document Reviewed: 05/09/2014 °Elsevier Interactive Patient Education © 2017 Elsevier Inc. ° °

## 2017-02-28 NOTE — Progress Notes (Signed)
PROGRESS NOTE        PATIENT DETAILS Name: Eric Myers Age: 56 y.o. Sex: male Date of Birth: 1961/11/11 Admit Date: 02/26/2017 Admitting Physician Pete Glatter, MD RUE:[id number] [name], MD  Brief Narrative: Patient is a 56 y.o. male with recent history of right shoulder surgery approximately 2 weeks back, subsequently, diagnosed with a pulmonary embolism last week and started on Xarelto. He was just recently discharged from [facility], he was doing well for 1-2 days, he then started developing worsening chest pain and shortness of breath, he then presented to the ED where a CT angiogram of the chest showed new pulmonary embolism in the right middle lobe pulmonary artery and lingular arteries. He was subsequently started on Lovenox-Xarelto was discontinued, and was admitted for further evaluation and treatment. See below for further details   Subjective: No chest pain-lying comfortably in bed. Does not appear to be short of breath. Spouse at bedside.  Assessment/Plan: Acute Pulmonary embolism: Suspect this to be provoked, given recent surgery. He was just recently discharged from [facility] a few days back on Xarelto, a CT angiogram of the chest done on 3/24,  demonstrates new areas of pulmonary embolism. Per family, a lower extremity Doppler done at [facility] was negative. I suspect that he could have had DVT in his iliac veins/IVC, that could have embolized.Repeat Doppler of lower and upper extremity are negative. Echo does not show RV strain. Patient has clinically improved, he is now ambulating in the hallway without chest pain or SOB. Spoke with Dr Robbie Louis over the phone, case discussed, he recommends we continue SQ Lovenox for 1-2 weeks, and then consider switching him to either coumadin or Pradaxa in 1-2 weeks time. I have reached out the Oncology group at [facility] to see if we can arrange follow up at [facility]  cancer center. Patient and Family aware that they will need age-appropriate cancer screening completed as an outpatient, and will need referral to hematology for hypercoagulable workup before long-term anticoagulation has been discontinued.  Pneumonia: Not sure if this is pneumonia or a lung infarction. In the meantime, continue with empiric antimicrobial therapy. He appears afebrile and nontoxic.  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  Mildly enlarged right paratracheal lymph node: Will require repeat imaging in the next 2-3 months. This will also be deferred to his primary care practitioner.  DVT Prophylaxis: Full dose anticoagulation with Lovenox  Code Status: Full code  Family Communication: Spouse at bedside  Disposition Plan: Remain inpatient-home in the next few days  Antimicrobial agents: Anti-infectives    Start     Dose/Rate Route Frequency Ordered Stop   02/28/17 1200  azithromycin (ZITHROMAX) tablet 500 mg     500 mg Oral Daily 02/28/17 1105 03/05/17 1159   02/26/17 1230  cefTRIAXone (ROCEPHIN) 1 g in dextrose 5 % 50 mL IVPB     1 g 100 mL/hr over 30 Minutes Intravenous Every 24 hours 02/26/17 1217 03/05/17 1229   02/26/17 1230  azithromycin (ZITHROMAX) 500 mg in dextrose 5 % 250 mL IVPB  Status:  Discontinued     500 mg 250 mL/hr over 60 Minutes Intravenous Every 24 hours 02/26/17 1217 02/28/17 1105      Procedures: None  CONSULTS:  None  Time spent: 25 minutes-Greater than  50% of this time was spent in counseling, explanation of diagnosis, planning of further management, and coordination of care.  MEDICATIONS: Scheduled Meds: . azithromycin  500 mg Oral Q1200  . cefTRIAXone (ROCEPHIN)  IV  1 g Intravenous Q24H  . docusate sodium  100 mg Oral BID  . enoxaparin (LOVENOX) injection  100 mg Subcutaneous Q12H  . sodium chloride flush  3 mL Intravenous Q12H   Continuous  Infusions: PRN Meds:.sodium chloride, acetaminophen, bisacodyl, ibuprofen, methocarbamol, oxyCODONE-acetaminophen, senna-docusate, sodium chloride flush, sodium phosphate   PHYSICAL EXAM: Vital signs: Vitals:   02/27/17 2016 02/28/17 0000 02/28/17 0331 02/28/17 1327  BP: 124/71  134/84   Pulse: 85  74 72  Resp: 16  17 15   Temp: 99.7 F (37.6 C) 98.5 F (36.9 C) 98.7 F (37.1 C) 98.6 F (37 C)  TempSrc:  Oral  Oral  SpO2: 100%  98% 98%  Weight:   100 kg (220 lb 6.4 oz)   Height:       Filed Weights   02/26/17 0655 02/26/17 1333 02/28/17 0331  Weight: 98.9 kg (218 lb) 100.3 kg (221 lb 3.2 oz) 100 kg (220 lb 6.4 oz)   Body mass index is 29.08 kg/m.   General appearance :Awake, alert, not in any distress. Speech Clear.  Eyes:, pupils equally reactive to light and accomodation,no scleral icterus. HEENT: Atraumatic and Normocephalic Neck: supple, no JVD. No cervical lymphadenopathy. Resp:Good air entry bilaterally, no added sounds  CVS: S1 S2 regular, no murmurs.  GI: Bowel sounds present, Non tender and not distended with no gaurding, rigidity or rebound.No organomegaly Extremities: B/L Lower Ext shows no edema, both legs are warm to touch Neurology:  speech clear,Non focal, sensation is grossly intact. Psychiatric: Normal judgment and insight. Alert and oriented x 3. Normal mood. Musculoskeletal:No digital cyanosis Skin:No Rash, warm and dry Wounds:N/A  I have personally reviewed following labs and imaging studies  LABORATORY DATA: CBC:  Recent Labs Lab 02/26/17 0715 02/27/17 0246  WBC 10.3 9.2  HGB 14.5 14.0  HCT 43.3 41.7  MCV 83.6 83.9  PLT 233 250    Basic Metabolic Panel:  Recent Labs Lab 02/26/17 0715 02/27/17 0246  NA 134* 133*  K 4.1 4.0  CL 101 98*  CO2 21* 25  GLUCOSE 131* 116*  BUN 18 21*  CREATININE 1.05 0.95  CALCIUM 9.3 9.1    GFR: Estimated Creatinine Clearance: 111.8 mL/min (by C-G formula based on SCr of 0.95 mg/dL).  Liver  Function Tests:  Recent Labs Lab 02/27/17 0246  AST 39  ALT 36  ALKPHOS 53  BILITOT 0.8  PROT 6.6  ALBUMIN 3.1*   No results for input(s): LIPASE, AMYLASE in the last 168 hours. No results for input(s): AMMONIA in the last 168 hours.  Coagulation Profile:  Recent Labs Lab 02/26/17 0715 02/27/17 0246  INR 1.42 1.13    Cardiac Enzymes: No results for input(s): CKTOTAL, CKMB, CKMBINDEX, TROPONINI in the last 168 hours.  BNP (last 3 results) No results for input(s): PROBNP in the last 8760 hours.  HbA1C: No results for input(s): HGBA1C in the last 72 hours.  CBG: No results for input(s): GLUCAP in the last 168 hours.  Lipid Profile: No results for input(s): CHOL, HDL, LDLCALC, TRIG, CHOLHDL, LDLDIRECT in the last 72 hours.  Thyroid Function Tests: No results for input(s): TSH, T4TOTAL, FREET4, T3FREE, THYROIDAB in the last 72 hours.  Anemia Panel: No results for input(s): VITAMINB12, FOLATE, FERRITIN, TIBC, IRON, RETICCTPCT in the last 72  hours.  Urine analysis:    Component Value Date/Time   COLORURINE YELLOW 02/26/2017 0900   APPEARANCEUR CLEAR 02/26/2017 0900   LABSPEC 1.024 02/26/2017 0900   PHURINE 5.0 02/26/2017 0900   GLUCOSEU NEGATIVE 02/26/2017 0900   HGBUR NEGATIVE 02/26/2017 0900   BILIRUBINUR NEGATIVE 02/26/2017 0900   KETONESUR NEGATIVE 02/26/2017 0900   PROTEINUR NEGATIVE 02/26/2017 0900   NITRITE NEGATIVE 02/26/2017 0900   LEUKOCYTESUR NEGATIVE 02/26/2017 0900    Sepsis Labs: Lactic Acid, Venous No results found for: LATICACIDVEN  MICROBIOLOGY: No results found for this or any previous visit (from the past 240 hour(s)).  RADIOLOGY STUDIES/RESULTS: Dg Chest 2 View  Result Date: 02/26/2017 CLINICAL DATA:  Shortness of breath and chest pain EXAM: CHEST  2 VIEW COMPARISON:  None. FINDINGS: There is atelectatic change in the lateral left base. Lungs elsewhere are clear. Heart size and pulmonary vascularity are normal. No adenopathy. No bone  lesions. IMPRESSION: Atelectasis lateral left base. Lungs elsewhere clear. Cardiac silhouette within normal limits. Electronically Signed   By: Bretta Bang III M.D.   On: 02/26/2017 07:54   Ct Angio Chest Pe W And/or Wo Contrast  Result Date: 02/26/2017 CLINICAL DATA:  Patient with history of pneumonia right-sided pulmonary embolus. Worsening right-sided chest pain. EXAM: CT ANGIOGRAPHY CHEST WITH CONTRAST TECHNIQUE: Multidetector CT imaging of the chest was performed using the standard protocol during bolus administration of intravenous contrast. Multiplanar CT image reconstructions and MIPs were obtained to evaluate the vascular anatomy. CONTRAST:  100 cc Isovue 370 COMPARISON:  Chest radiograph 02/26/2017 PE study 02/22/2017 FINDINGS: Cardiovascular: Adequate opacification of the pulmonary arterial system. Filling defects are identified within the right middle and lower lobe lobar and segmental pulmonary arteries. Filling defect within the right middle lobe is new from prior. Additionally new filling defects are demonstrated within the left upper lobe segmental pulmonary arteries. RV/LV ratio of 1.1. Normal heart size. No pericardial effusion. Ascending thoracic aorta measures 4.3 cm. Main pulmonary artery upper limits normal measuring 3.0 cm. Mediastinum/Nodes: Esophagus is unremarkable. Prominent and mildly enlarged right peritracheal lymph nodes measuring up to 11 mm (image 48; series 6). Additionally, within the right peritracheal location there is prominent soft tissue (image 82; series 6) measuring 1.9 cm in diameter, potentially secondary to on opacified azygos. Lungs/Pleura: Central airways are patent. Moderate layering right pleural effusion. Subpleural consolidation within the right lower lobe. Patchy consolidation within the lingula. No pneumothorax. Upper Abdomen: No acute process. Musculoskeletal: Thoracic spine degenerative changes. No aggressive or acute appearing osseous lesions. Review  of the MIP images confirms the above findings. IMPRESSION: Findings compatible with acute pulmonary embolus. There appears to be new thrombus within the distal right middle lobe pulmonary arteries and lingular pulmonary arteries. Minimal residual thrombus within the right lower lobe pulmonary arteries. Positive for acute PE with CT evidence of right heart strain (RV/LV Ratio = 1.1) consistent with at least submassive (intermediate risk) PE. The presence of right heart strain has been associated with an increased risk of morbidity and mortality. Please activate Code PE by paging [phone]. Moderate layering right pleural effusion. Subpleural consolidation within the right lower lobe as well as patchy consolidation within the lingula which may represent atelectasis or infection. Prominent mildly enlarged right peritracheal lymph nodes. Additionally there is prominent soft tissue at the expected location of the azygos, potentially representing non-opacified azygos. Lymph nodes may be reactive in etiology. Given multitude of pulmonary findings, recommend follow-up chest CT in 2- 3 months to assess for interval resolution. Ascending  thoracic aorta measures 4.3 cm. Recommend annual imaging followup by CTA or MRA. This recommendation follows 2010 ACCF/AHA/AATS/ACR/ASA/SCA/SCAI/SIR/STS/SVM Guidelines for the Diagnosis and Management of Patients with Thoracic Aortic Disease. Circulation. 2010; 121: R604-V409 Critical Value/emergent results were called by telephone at the time of interpretation on 02/26/2017 at 10:41 am to Dr. Lynden Oxford , who verbally acknowledged these results. Electronically Signed   By: Annia Belt M.D.   On: 02/26/2017 10:52     LOS: 1 day   Jeoffrey Massed, MD  [facility] Pager:[phone]  If 7PM-7AM, please contact night-coverage [email or web address] Password [email or web address] 02/28/2017, 2:01 PM

## 2017-02-28 NOTE — Progress Notes (Signed)
VASCULAR LAB PRELIMINARY  PRELIMINARY  PRELIMINARY  PRELIMINARY  Bilateral upper extremity venous duplex completed.    Preliminary report:  There is no DVT or SVT noted in the visualized veins of the bilateral upper extremities.   Mycah Mcdougall, RVT 02/28/2017, 10:18 AM

## 2017-02-28 NOTE — Progress Notes (Signed)
ANTICOAGULATION CONSULT NOTE  Pharmacy Consult:  Lovenox Indication:  New RML PE with recent PE  Allergies  Allergen Reactions  . Morphine And Related Itching and Other (See Comments)    Rashy red areas and doesn't help pain    Patient Measurements: Height: 6\' 1"  (185.4 cm) Weight: 220 lb 6.4 oz (100 kg) IBW/kg (Calculated) : 79.9  Vital Signs: Temp: 98.6 F (37 C) (03/26 1327) Temp Source: Oral (03/26 1327) BP: 134/84 (03/26 0331) Pulse Rate: 72 (03/26 1327)  Labs:  Recent Labs  02/26/17 0715 02/27/17 0246  HGB 14.5 14.0  HCT 43.3 41.7  PLT 233 250  APTT  --  47*  LABPROT 17.5* 14.6  INR 1.42 1.13  CREATININE 1.05 0.95    Estimated Creatinine Clearance: 111.8 mL/min (by C-G formula based on SCr of 0.95 mg/dL).   Assessment: [age] YOM with history of right should arthroscopy about 2 weeks ago.  Patient presented to [facility] on 02/22/17 and was diagnosed with PE and PNA.  He was given Lovenox during that hospitalization and discharged home on 02/24/17 on Xarelto that started the same night.  Patient [facility] today with DOE, pleuritic chest pain and fevers.  CT shows new RML thrombus.  Pharmacy consulted to transition to once daily Lovenox for discharge.  No bleeding noted, CBC stable.  Goal of Therapy:  Anti-Xa level 1-2 units/ml 4 hrs after Lovenox dose Monitor platelets by anticoagulation protocol: Yes   Plan:  - Change Lovenox to 150 mg SQ q24h starting tonight - CBC q72h while on Lovenox - Monitor for s/sx of bleeding - Discussed Lovenox with patient and wife - all questions answered  Loura BackJennifer Oakley, PharmD, BCPS Clinical Pharmacist Phone for today [phone] Main pharmacy - [phone] 02/28/2017 2:04 PM

## 2017-03-11 DIAGNOSIS — R599 Enlarged lymph nodes, unspecified: Secondary | ICD-10-CM

## 2017-03-11 DIAGNOSIS — I712 Thoracic aortic aneurysm, without rupture: Secondary | ICD-10-CM | POA: Diagnosis not present

## 2017-03-11 DIAGNOSIS — R42 Dizziness and giddiness: Secondary | ICD-10-CM

## 2017-03-11 DIAGNOSIS — I2699 Other pulmonary embolism without acute cor pulmonale: Secondary | ICD-10-CM | POA: Diagnosis not present

## 2017-05-05 DIAGNOSIS — I77810 Thoracic aortic ectasia: Secondary | ICD-10-CM | POA: Diagnosis not present

## 2017-05-05 DIAGNOSIS — Z86711 Personal history of pulmonary embolism: Secondary | ICD-10-CM | POA: Diagnosis not present

## 2017-05-05 DIAGNOSIS — R918 Other nonspecific abnormal finding of lung field: Secondary | ICD-10-CM | POA: Diagnosis not present

## 2017-08-04 DIAGNOSIS — I77810 Thoracic aortic ectasia: Secondary | ICD-10-CM | POA: Diagnosis not present

## 2017-08-04 DIAGNOSIS — Z86711 Personal history of pulmonary embolism: Secondary | ICD-10-CM | POA: Diagnosis not present

## 2017-08-04 DIAGNOSIS — R918 Other nonspecific abnormal finding of lung field: Secondary | ICD-10-CM | POA: Diagnosis not present

## 2018-01-30 ENCOUNTER — Ambulatory Visit (INDEPENDENT_AMBULATORY_CARE_PROVIDER_SITE_OTHER): Payer: Self-pay | Admitting: Allergy and Immunology

## 2018-01-30 ENCOUNTER — Encounter: Payer: Self-pay | Admitting: Allergy and Immunology

## 2018-01-30 VITALS — BP 130/90 | HR 84 | Temp 98.5°F | Resp 16 | Ht 71.0 in | Wt 233.2 lb

## 2018-01-30 DIAGNOSIS — J4530 Mild persistent asthma, uncomplicated: Secondary | ICD-10-CM

## 2018-01-30 DIAGNOSIS — K219 Gastro-esophageal reflux disease without esophagitis: Secondary | ICD-10-CM

## 2018-01-30 DIAGNOSIS — J3089 Other allergic rhinitis: Secondary | ICD-10-CM

## 2018-01-30 MED ORDER — MOMETASONE FUROATE 220 MCG/INH IN AEPB: 1.0000 | INHALATION_SPRAY | Freq: Every day | RESPIRATORY_TRACT | 3 refills | Status: DC

## 2018-01-30 MED ORDER — RANITIDINE HCL 300 MG PO TABS: 300.0000 mg | ORAL_TABLET | Freq: Every day | ORAL | 5 refills | Status: DC

## 2018-01-30 MED ORDER — OMEPRAZOLE 40 MG PO CPDR: 40.0000 mg | DELAYED_RELEASE_CAPSULE | Freq: Every day | ORAL | 5 refills | Status: DC

## 2018-01-30 MED ORDER — ALBUTEROL SULFATE HFA 108 (90 BASE) MCG/ACT IN AERS: 2.0000 | INHALATION_SPRAY | RESPIRATORY_TRACT | 3 refills | Status: DC | PRN

## 2018-01-30 NOTE — Progress Notes (Signed)
Dear Dr. Jeanie Sewer,  Thank you for referring Eric Myers to the [facility] [place] on 01/30/2018.   Below is a summation of this patient's evaluation and recommendations.  Thank you for your referral. I will keep you informed about this patient's response to treatment.   If you have any questions please do not hesitate to contact me.   Sincerely,  Jessica Priest, MD Allergy / Immunology [facility] [facility]   ______________________________________________________________________    NEW PATIENT NOTE  Referring Provider: [name], MD Primary Provider: [name], MD Date of office visit: 01/30/2018    Subjective:   Chief Complaint:  Eric Myers (DOB: 20-Sep-1961) is a 57 y.o. male who presents to the clinic on 01/30/2018 with a chief complaint of coughing (sob, wheezing, chest tightness) .     HPI: Brett Canales presents to this clinic in evaluation of breathing problems.  He believes that his breathing has been "not right" since he developed a PE in March 2018 that was evaluated by Dr. Gilman Buttner and treated with Xarelto until November 2018.  Apparently this was triggered by shoulder surgery.  However, the big issue for Brett Canales above and beyond this post PE issue has developed over the course of the past 3 months.  He has noticed some intermittent coughing and some intermittent shortness of breath and chest tightness and wheezing of both an inspiratory and expiratory nature.  This does not appear to be precipitated by exercise.  This does not appear to be precipitated by cold air exposure.  He has woken up at nighttime with coughing spells about 1 time per week.  He feels as though he has something "clogged up" in his throat.  He has lots of throat clearing.  He gets intermittently raspy.  He does have an issue with reflux on a rare event.  He will take Tums every few months.  He does consume  caffeine about 4 drinks per day but does not consume any chocolate or alcohol and does not use any tobacco.  He has gained 15 pounds of weight since Christmas.  He does have nasal congestion and some occasional sneezing but no anosmia no headaches and no history of recurrent sinusitis.  He does snore and his wife has documented gasping and apnea.  Past Medical History:  Diagnosis Date  . Pulmonary embolism [facility])     Past Surgical History:  Procedure Laterality Date  . ADENOIDECTOMY  1990  . ROTATOR CUFF REPAIR    . TONSILLECTOMY  1990    Allergies as of 01/30/2018      Reactions   Morphine And Related Itching, Other (See Comments)   Rashy red areas and doesn't help pain      Medication List      aspirin EC 81 MG tablet Take 81 mg by mouth daily.       Review of systems negative except as noted in HPI / PMHx or noted below:  Review of Systems  Constitutional: Negative.   HENT: Negative.   Eyes: Negative.   Respiratory: Negative.   Cardiovascular: Negative.   Gastrointestinal: Negative.   Genitourinary: Negative.   Musculoskeletal: Negative.   Skin: Negative.   Neurological: Negative.   Endo/Heme/Allergies: Negative.   Psychiatric/Behavioral: Negative.     Family History  Problem Relation Age of Onset  . Alzheimer's disease Mother   . Dementia Mother   . Hypothyroidism Mother   .  Alzheimer's disease Father   . Dementia Father   . Liver cancer Maternal Grandfather     Social History   Socioeconomic History  . Marital status: Married    Spouse name: Not on file  . Number of children: Not on file  . Years of education: Not on file  . Highest education level: Not on file  Social Needs  . Financial resource strain: Not on file  . Food insecurity - worry: Not on file  . Food insecurity - inability: Not on file  . Transportation needs - medical: Not on file  . Transportation needs - non-medical: Not on file  Occupational History  . Not on file  Tobacco  Use  . Smoking status: Never Smoker  . Smokeless tobacco: Never Used  Substance and Sexual Activity  . Alcohol use: No  . Drug use: No  . Sexual activity: Not on file  Other Topics Concern  . Not on file  Social History Narrative  . Not on file    Environmental and Social history  Lives in a house with a dry environment, no animals located inside the household, carpet in the bedroom, no smoking ongoing with inside the household and employment as a Product/process development scientistgeneral contractor with [employer].  Objective:   Vitals:   01/30/18 1406  BP: 130/90  Pulse: 84  Resp: 16  Temp: 98.5 F (36.9 C)   Height: 5\' 11"  (180.3 cm) Weight: 233 lb 3.2 oz (105.8 kg)  Physical Exam  Constitutional: He is well-developed, well-nourished, and in no distress.  HENT:  Head: Normocephalic. Head is without right periorbital erythema and without left periorbital erythema.  Right Ear: Tympanic membrane, external ear and ear canal normal.  Left Ear: Tympanic membrane, external ear and ear canal normal.  Nose: Nose normal. No mucosal edema or rhinorrhea.  Mouth/Throat: Uvula is midline, oropharynx is clear and moist and mucous membranes are normal. No oropharyngeal exudate.  Eyes: Conjunctivae and lids are normal. Pupils are equal, round, and reactive to light.  Neck: Trachea normal. No tracheal tenderness present. No tracheal deviation present. No thyromegaly present.  Cardiovascular: Normal rate, regular rhythm, S1 normal, S2 normal and normal heart sounds.  No murmur heard. Pulmonary/Chest: Effort normal and breath sounds normal. No stridor. No tachypnea. No respiratory distress. He has no wheezes. He has no rales. He exhibits no tenderness.  Abdominal: Soft. He exhibits no distension and no mass. There is no hepatosplenomegaly. There is no tenderness. There is no rebound and no guarding.  Musculoskeletal: He exhibits no edema or tenderness.  Lymphadenopathy:       Head (right side): No tonsillar  adenopathy present.       Head (left side): No tonsillar adenopathy present.    He has no cervical adenopathy.    He has no axillary adenopathy.  Neurological: He is alert. Gait normal.  Skin: No rash noted. He is not diaphoretic. No erythema. No pallor. Nails show no clubbing.  Psychiatric: Mood and affect normal.    Diagnostics: Allergy skin tests were performed.  He demonstrated hypersensitivity to grasses, weeds, trees, and dog  Spirometry was performed and demonstrated an FEV1 of 3.10 @ 79 % of predicted. FEV1/FVC = 0.73.  Following administration of nebulized albuterol his FEV1 rose to 3.41 which was an increase in the FEV1 of 10%  Assessment and Plan:    1. Not well controlled mild persistent asthma   2. Other allergic rhinitis   3. LPRD (laryngopharyngeal reflux disease)  1.  Allergen avoidance measures  2.  Treat and prevent inflammation:   A. Asmanex 220 twisthaler - one inhalation one time per day   B. OTC Nasacort - one spray each nostril one time per day  3. Treat and prevent reflux:   A. Consolidate caffeine consumption as much as possible  B. Omeprazole 40 mg one tablet one time per day  C. Ranitidine 300 mg one tablet one time per day  4. If needed: Proair HFA or similar 2 inhalations every 4-6 hours  5. Review previous x-rays, CT scans, other studies, blood test  6. Return to clinic in 3 weeks  7.  Sleep apnea?  Weight gain?  Brett Canales appears to have atopic disease giving rise to respiratory tract inflammation and we will have him perform allergen avoidance measures as best as possible and utilize a combination of anti-inflammatory agents for both his upper and lower airway.  As well, he has a history very consistent with LPR and we will get him to consolidate his caffeine and use a aggressive plan directed against acid production.  I will review all of his previous studies that have been performed in the past year in the treatment of his PE.  There are  issues of possible sleep apnea and he has gained weight recently which may need to be evaluated.  I will see him back in this clinic in 3 weeks or earlier if there is a problem.  Jessica Priest, MD Allergy / Immunology [place] [facility] [place]

## 2018-01-30 NOTE — Patient Instructions (Addendum)
  1.  Allergen avoidance measures  2.  Treat and prevent inflammation:   A. Asmanex 220 twisthaler - one inhalation one time per day   B. OTC Nasacort - one spray each nostril one time per day  3. Treat and prevent reflux:   A. Consolidate caffeine consumption as much as possible  B. Omeprazole 40 mg one tablet one time per day  C. Ranitidine 300 mg one tablet one time per day  4. If needed: Proair HFA or similar 2 inhalations every 4-6 hours  5. Review previous x-rays, CT scans, other studies, blood test  6. Return to clinic in 3 weeks  7.  Sleep apnea?  Weight gain?

## 2018-01-31 ENCOUNTER — Encounter: Payer: Self-pay | Admitting: Allergy and Immunology

## 2018-02-20 ENCOUNTER — Encounter: Payer: Self-pay | Admitting: Allergy and Immunology

## 2018-02-20 ENCOUNTER — Ambulatory Visit (INDEPENDENT_AMBULATORY_CARE_PROVIDER_SITE_OTHER): Payer: Self-pay | Admitting: Allergy and Immunology

## 2018-02-20 VITALS — BP 122/74 | HR 86 | Resp 18

## 2018-02-20 DIAGNOSIS — J3089 Other allergic rhinitis: Secondary | ICD-10-CM

## 2018-02-20 DIAGNOSIS — J4530 Mild persistent asthma, uncomplicated: Secondary | ICD-10-CM

## 2018-02-20 DIAGNOSIS — K219 Gastro-esophageal reflux disease without esophagitis: Secondary | ICD-10-CM

## 2018-02-20 NOTE — Progress Notes (Signed)
Follow-up Note  Referring Provider: Noni Saupeedding, John F. II, MD Primary Provider: Noni Saupeedding, John F. II, MD Date of Office Visit: 02/20/2018  Subjective:   Eric Myers (DOB: [birth year]) is a 57 y.o. male who [facility] on 02/20/2018 in re-evaluation of the following:  HPI: Eric Myers presents to this clinic in evaluation of asthma and allergic rhinitis and LPR.  His last visit to this clinic was his initial evaluation of 30 January 2018.  He has done much better with his plan.  He no longer wakes up at nighttime with coughing spells and he feels as though his breathing is much better and he does not use a short acting bronchodilator and he has had no issues with reflux and his snoring has decreased dramatically.  He did notice on 2 occasions when using Asmanex that he did develop what sounds like laryngeal spasm with sudden onset coughing and his throat getting clogged up for less than a minute or so.  Allergies as of 02/20/2018      Reactions   Morphine And Related Itching, Other (See Comments)   Rashy red areas and doesn't help pain      Medication List      albuterol 108 (90 Base) MCG/ACT inhaler Commonly known as:  PROAIR HFA Inhale 2 puffs into the lungs every 4 (four) hours as needed for wheezing or shortness of breath.   aspirin EC 81 MG tablet Take 81 mg by mouth daily.   mometasone 220 MCG/INH inhaler Commonly known as:  ASMANEX 30 METERED DOSES Inhale 1 puff into the lungs daily.   omeprazole 40 MG capsule Commonly known as:  PRILOSEC Take 1 capsule (40 mg total) by mouth daily.   ranitidine 300 MG tablet Commonly known as:  ZANTAC Take 1 tablet (300 mg total) by mouth at bedtime.       Past Medical History:  Diagnosis Date  . Asthma   . Pulmonary embolism [facility](HCC)     Past Surgical History:  Procedure Laterality Date  . ADENOIDECTOMY  1990  . ROTATOR CUFF REPAIR    . TONSILLECTOMY  1990    Review of systems negative  except as noted in HPI / PMHx or noted below:  Review of Systems  Constitutional: Negative.   HENT: Negative.   Eyes: Negative.   Respiratory: Negative.   Cardiovascular: Negative.   Gastrointestinal: Negative.   Genitourinary: Negative.   Musculoskeletal: Negative.   Skin: Negative.   Neurological: Negative.   Endo/Heme/Allergies: Negative.   Psychiatric/Behavioral: Negative.      Objective:   Vitals:   02/20/18 1641  BP: 122/74  Pulse: 86  Resp: 18          Physical Exam  Constitutional: He is well-developed, well-nourished, and in no distress.  HENT:  Head: Normocephalic.  Right Ear: Tympanic membrane, external ear and ear canal normal.  Left Ear: Tympanic membrane, external ear and ear canal normal.  Nose: Nose normal. No mucosal edema or rhinorrhea.  Mouth/Throat: Uvula is midline, oropharynx is clear and moist and mucous membranes are normal. No oropharyngeal exudate.  Eyes: Conjunctivae are normal.  Neck: Trachea normal. No tracheal tenderness present. No tracheal deviation present. No thyromegaly present.  Cardiovascular: Normal rate, regular rhythm, S1 normal, S2 normal and normal heart sounds.  No murmur heard. Pulmonary/Chest: Breath sounds normal. No stridor. No respiratory distress. He has no wheezes. He has no rales.  Musculoskeletal: He exhibits no edema.  Lymphadenopathy:  Head (right side): No tonsillar adenopathy present.       Head (left side): No tonsillar adenopathy present.    He has no cervical adenopathy.  Neurological: He is alert. Gait normal.  Skin: No rash noted. He is not diaphoretic. No erythema. Nails show no clubbing.  Psychiatric: Mood and affect normal.    Diagnostics:    Spirometry was performed and demonstrated an FEV1 of 3.15 at 81 % of predicted.  The patient had an Asthma Control Test with the following results: ACT Total Score: 22.    Assessment and Plan:   1. Asthma, well controlled, mild persistent   2. Other  allergic rhinitis   3. LPRD (laryngopharyngeal reflux disease)     1.  Allergen avoidance measures  2.  Treat and prevent inflammation:   A. Asmanex 220 twisthaler - one inhalation one time per day   B. OTC Nasacort - one spray each nostril one time per day  3. Treat and prevent reflux:   A. Consolidate caffeine consumption as much as possible  B. Omeprazole 40 mg one tablet one time per day  C. Ranitidine 300 mg one tablet one time per day  4. If needed: Proair HFA or similar 2 inhalations every 4-6 hours  5.  Return to clinic in 8 weeks or earlier if problem  Overall Eric Myers appears to have had a pretty good response to his current medical plan and we will continue to have him use 12 weeks of this therapy and then if he continues to do well we will make an attempt to consolidate his treatment with that visit.  He will contact me during the interval should he have a significant problem.   Laurette Schimke, MD Allergy / Immunology [facility]

## 2018-02-20 NOTE — Patient Instructions (Addendum)
  1.  Allergen avoidance measures  2.  Treat and prevent inflammation:   A. Asmanex 220 twisthaler - one inhalation one time per day   B. OTC Nasacort - one spray each nostril one time per day  3. Treat and prevent reflux:   A. Consolidate caffeine consumption as much as possible  B. Omeprazole 40 mg one tablet one time per day  C. Ranitidine 300 mg one tablet one time per day  4. If needed: Proair HFA or similar 2 inhalations every 4-6 hours  5. Return to clinic in 8 weeks or earlier if problem

## 2018-02-21 ENCOUNTER — Encounter: Payer: Self-pay | Admitting: Allergy and Immunology

## 2018-04-17 ENCOUNTER — Ambulatory Visit: Payer: Self-pay | Admitting: Allergy and Immunology

## 2018-05-08 ENCOUNTER — Ambulatory Visit: Payer: Self-pay | Admitting: Allergy and Immunology

## 2018-12-22 DIAGNOSIS — R918 Other nonspecific abnormal finding of lung field: Secondary | ICD-10-CM

## 2018-12-22 DIAGNOSIS — I77810 Thoracic aortic ectasia: Secondary | ICD-10-CM

## 2018-12-22 DIAGNOSIS — Z86711 Personal history of pulmonary embolism: Secondary | ICD-10-CM

## 2019-01-31 IMAGING — CR DG CHEST 2V
2 series · 2 of 2 positions shown · non-contrast
Comparison: None.

CLINICAL DATA: Shortness of breath and chest pain

EXAM:
CHEST  2 VIEW

[chest lat]
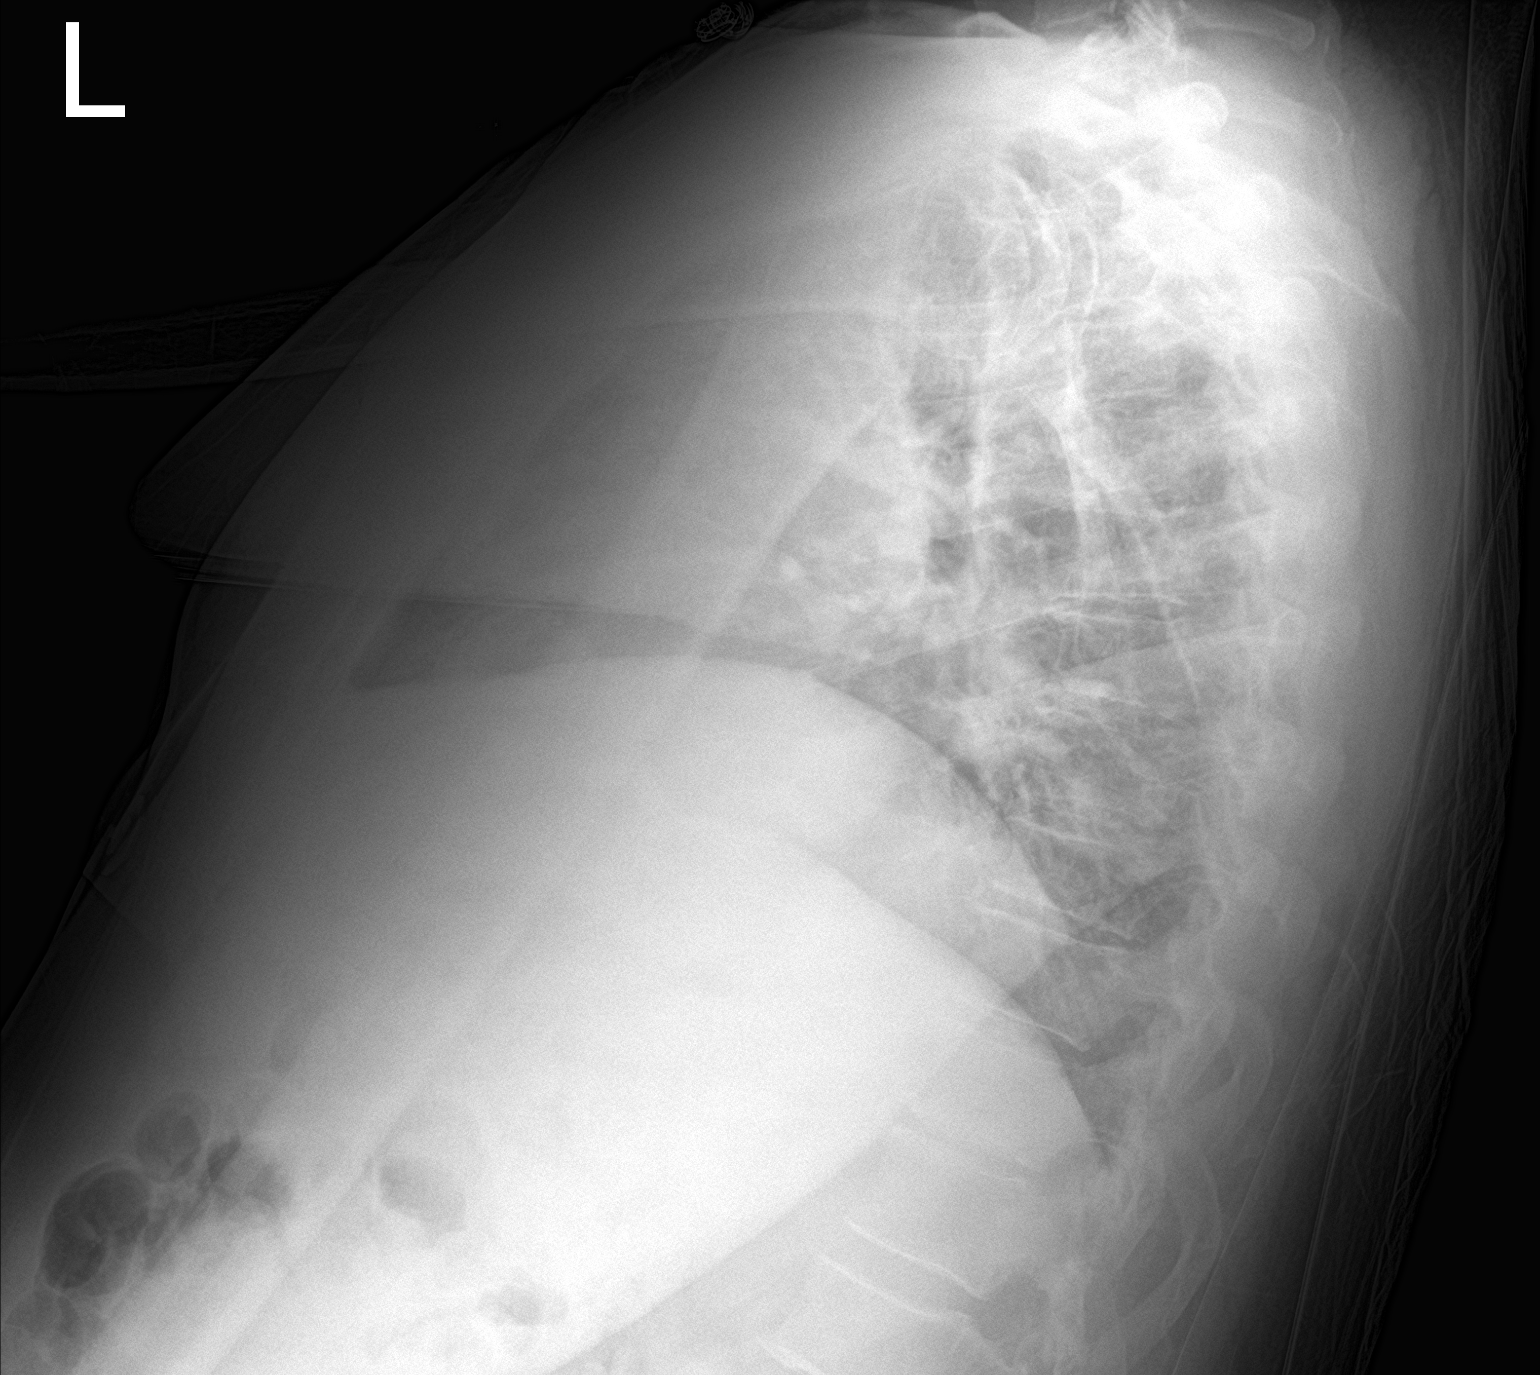

[chest ap]
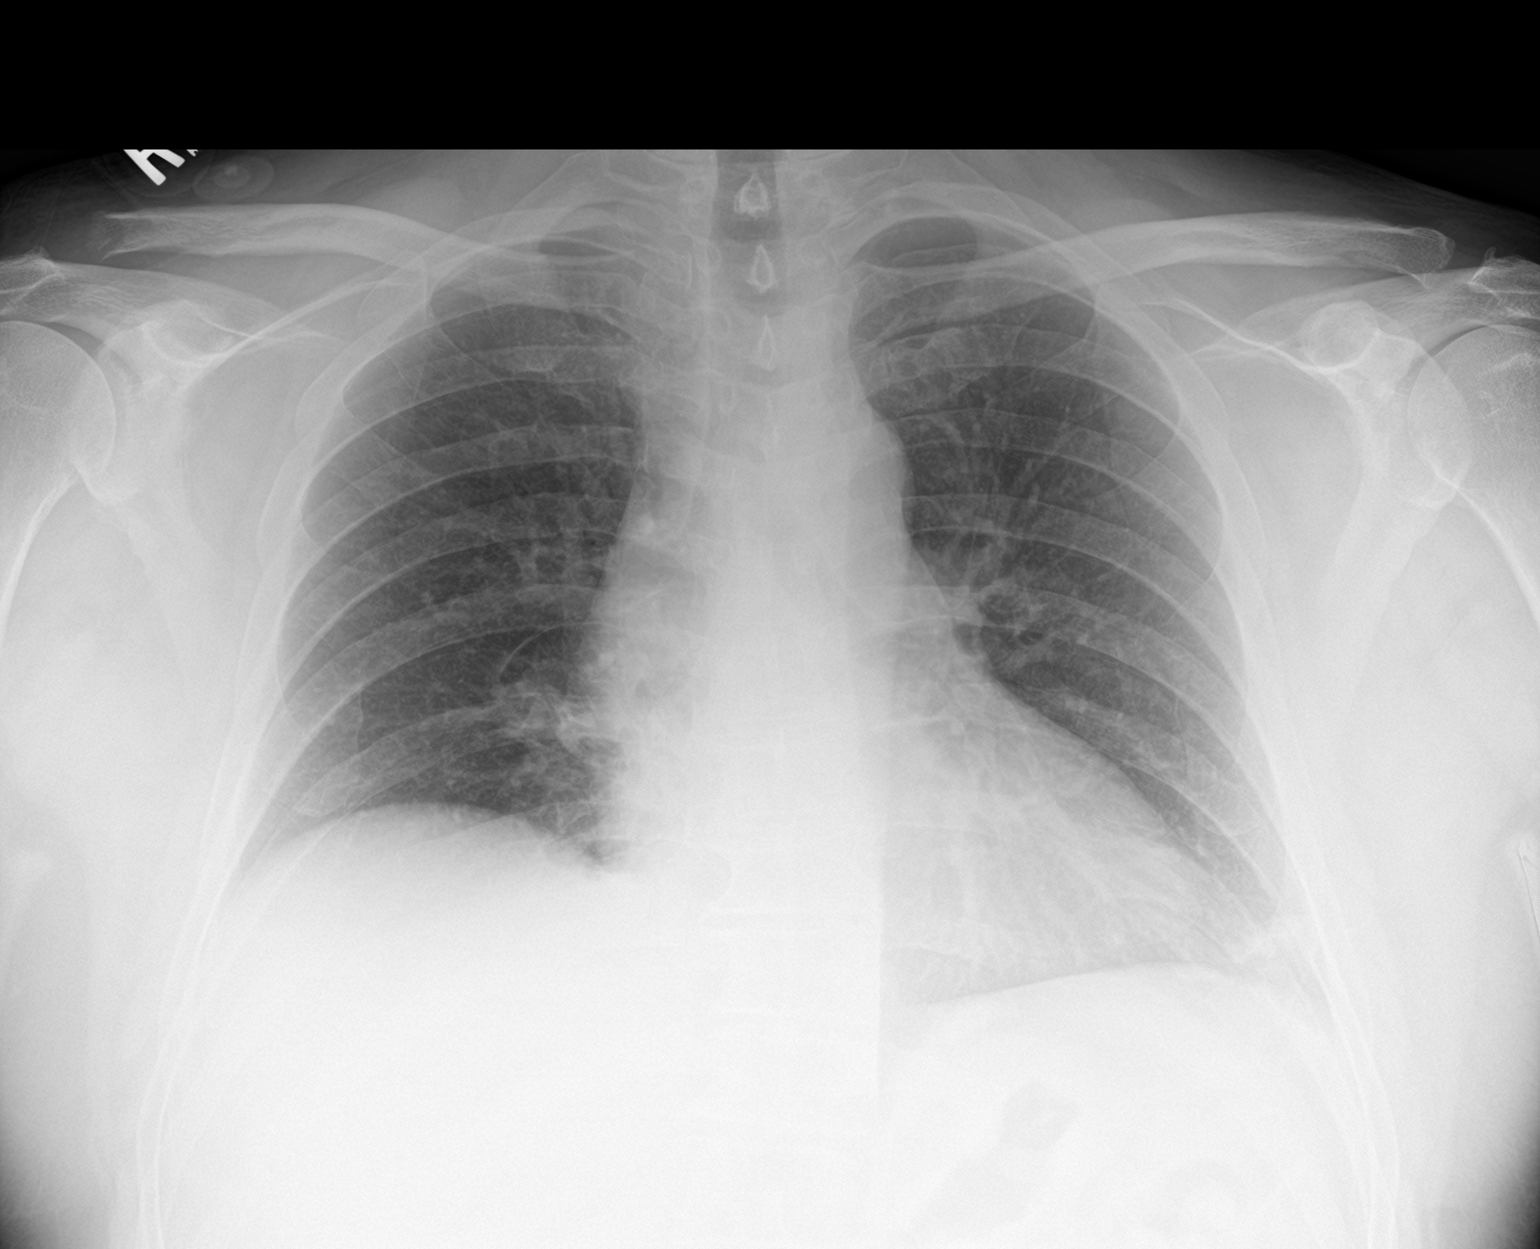

[2 of 2 positions shown; findings below may reference images not displayed]

FINDINGS: There is atelectatic change in the lateral left base. Lungs
elsewhere are clear. Heart size and pulmonary vascularity are
normal. No adenopathy. No bone lesions.
IMPRESSION: Atelectasis lateral left base. Lungs elsewhere clear. Cardiac
silhouette within normal limits.

## 2019-01-31 IMAGING — CT CT ANGIO CHEST
2 of 8 series · 17 of 46 positions shown · IV contrast (APPLIED)
Comparison: Chest radiograph 02/26/2017 PE study 02/22/2017

CLINICAL DATA: Patient with history of pneumonia right-sided
pulmonary embolus. Worsening right-sided chest pain.

EXAM:
CT ANGIOGRAPHY CHEST WITH CONTRAST
TECHNIQUE: Multidetector CT imaging of the chest was performed using the
standard protocol during bolus administration of intravenous
contrast. Multiplanar CT image reconstructions and MIPs were
obtained to evaluate the vascular anatomy.
CONTRAST:  100 cc Isovue 370

[Series 6: thins · axial · 0.81mm/px · z∈[-270,-50]mm · 14 of 242 slices shown]
[im 11/242  lung]
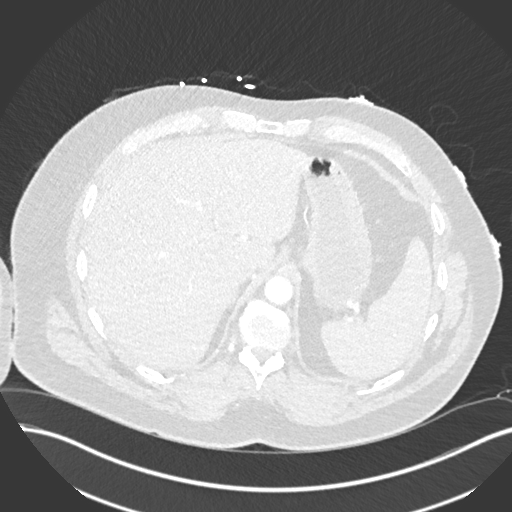
[im 33/242  soft-tissue]
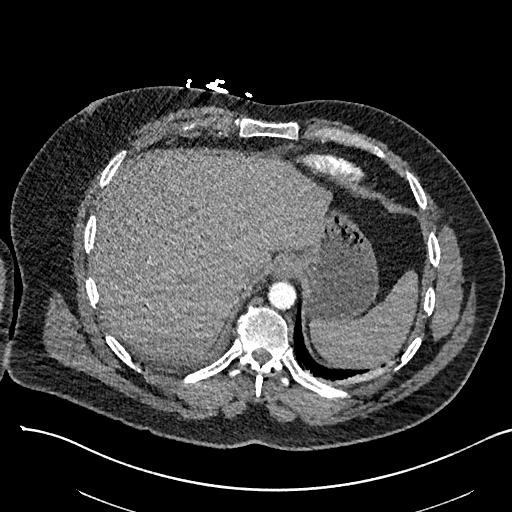
[im 44/242  lung]
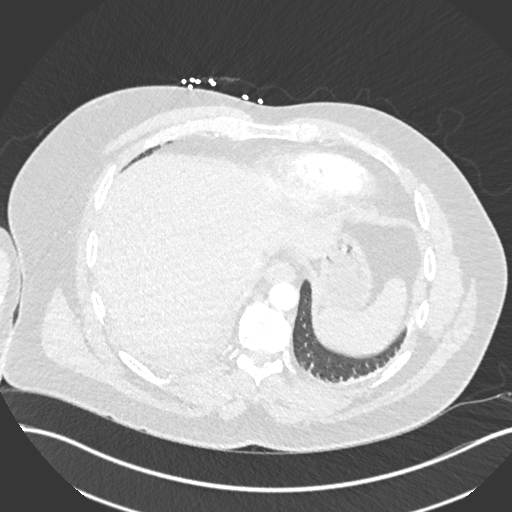
[im 66/242  soft-tissue]
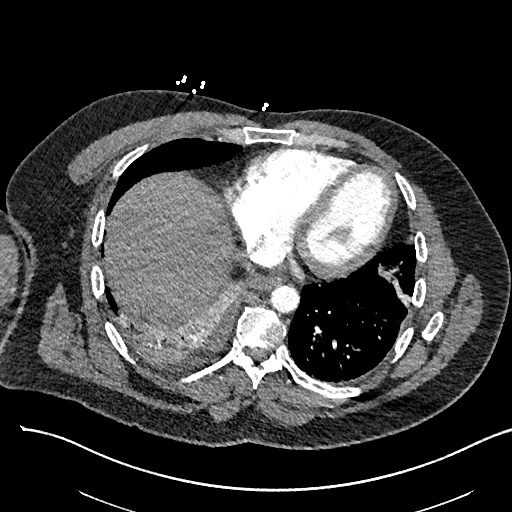
[im 77/242  lung]
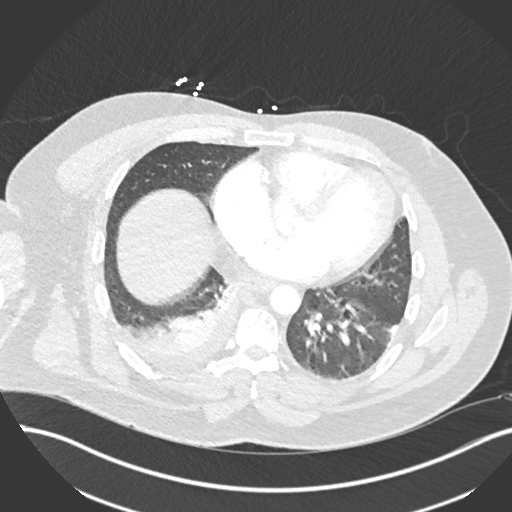
[im 99/242  soft-tissue]
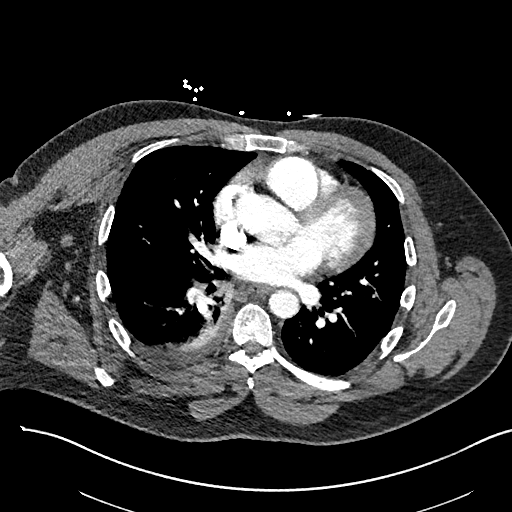
[im 110/242  lung]
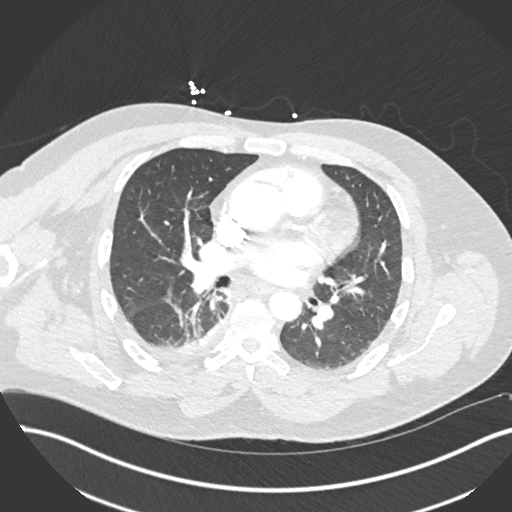
[im 132/242  soft-tissue]
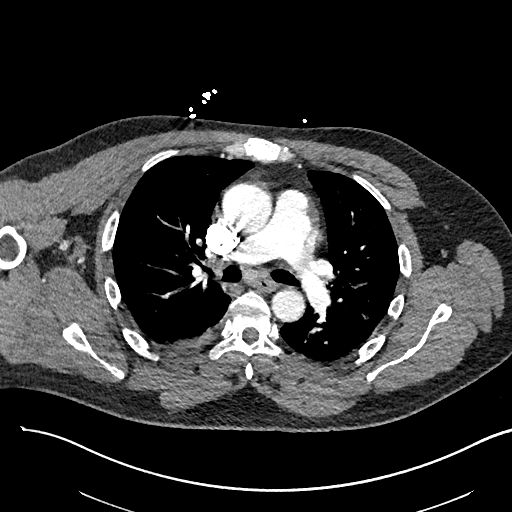
[im 143/242  lung]
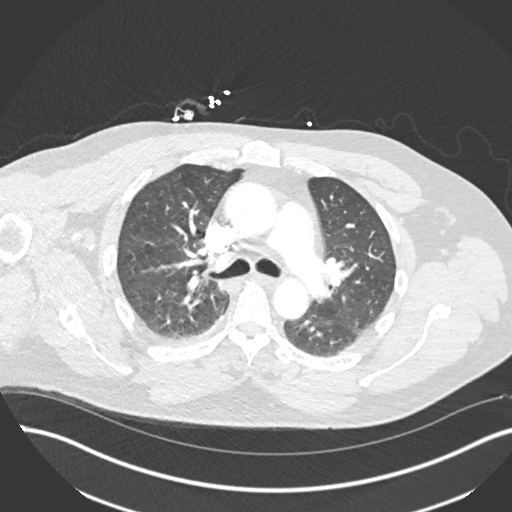
[im 165/242  soft-tissue]
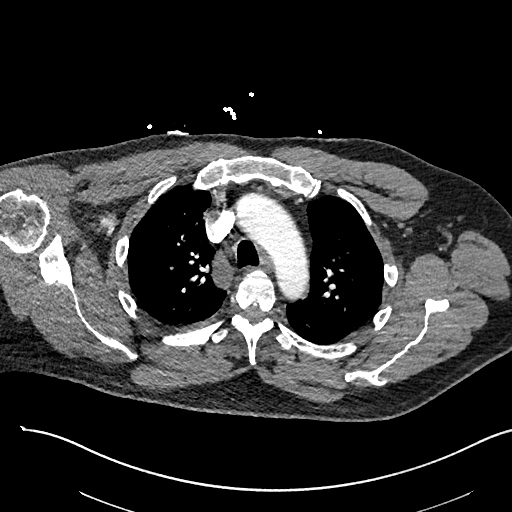
[im 176/242  lung]
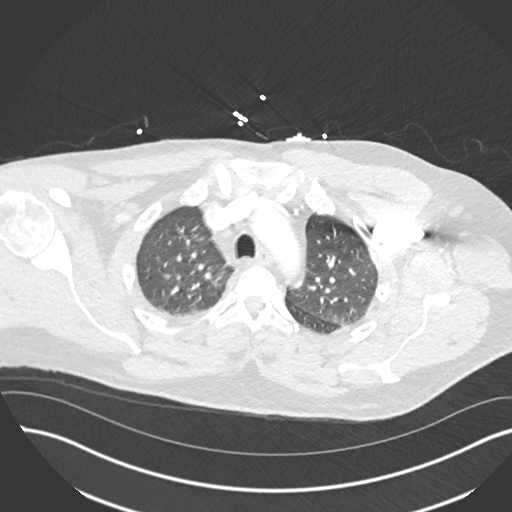
[im 198/242  soft-tissue]
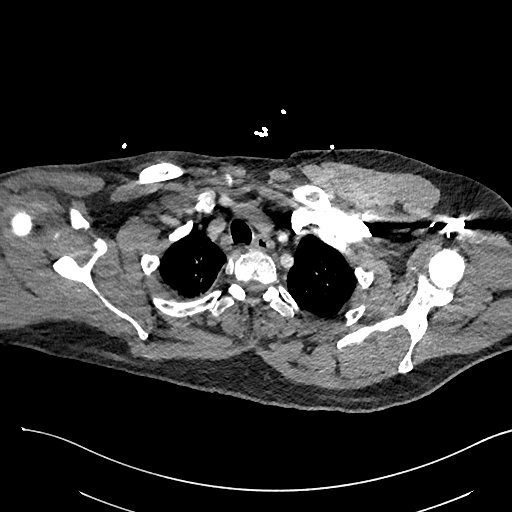
[im 209/242  lung]
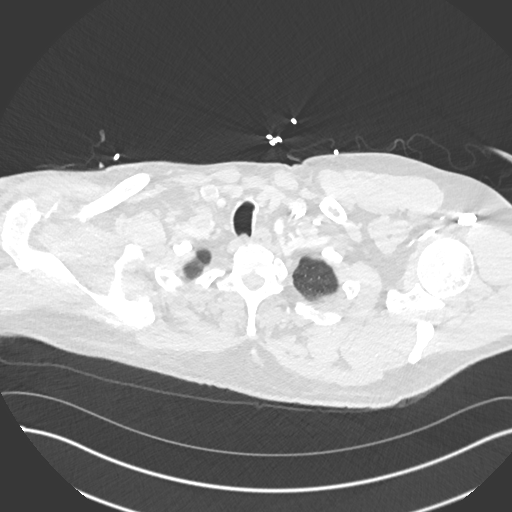
[im 231/242  soft-tissue]
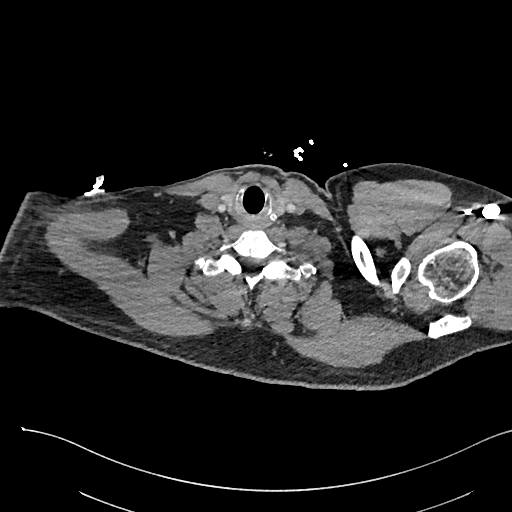

[Series 8: coronal mpr · coronal · 0.52mm/px · 3 of 151 slices shown]
[im 38/151  soft-tissue]
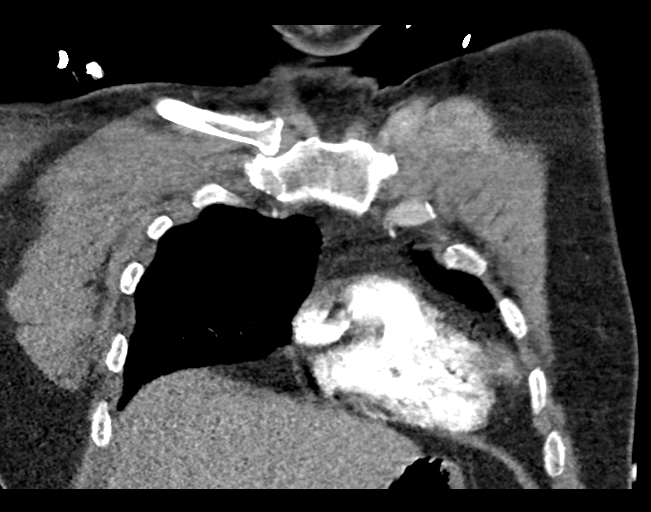
[im 76/151  soft-tissue]
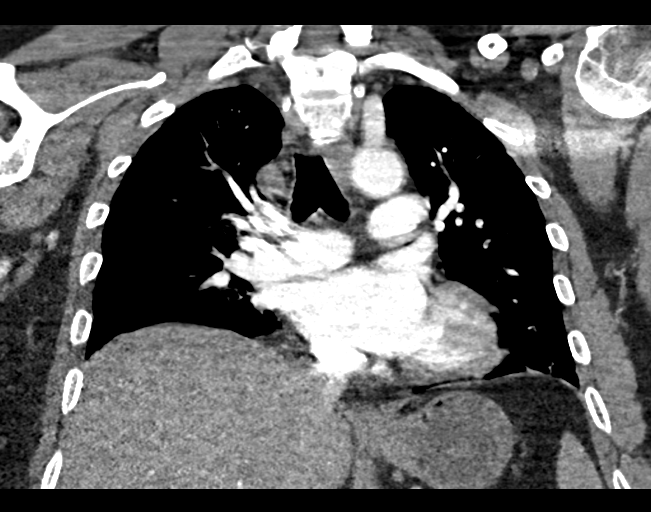
[im 113/151  soft-tissue]
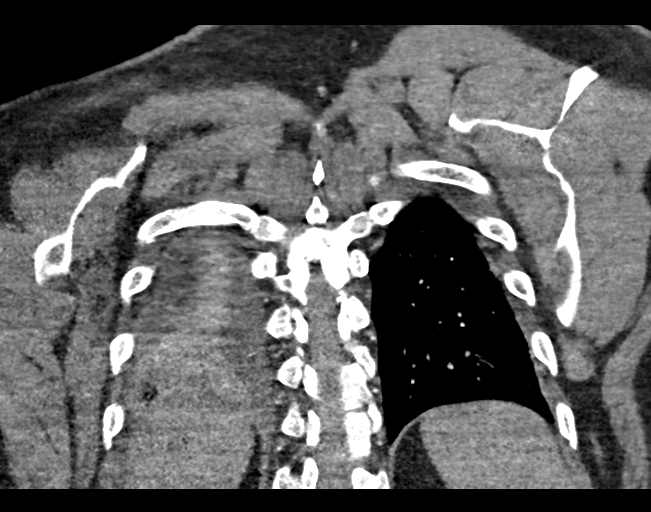

[17 of 46 positions shown; findings below may reference images not displayed]

FINDINGS: Cardiovascular: Adequate opacification of the pulmonary arterial
system. Filling defects are identified within the right middle and
lower lobe lobar and segmental pulmonary arteries. Filling defect
within the right middle lobe is new from prior. Additionally new
filling defects are demonstrated within the left upper lobe
segmental pulmonary arteries. RV/LV ratio of 1.1. Normal heart size.
No pericardial effusion. Ascending thoracic aorta measures 4.3 cm.
Main pulmonary artery upper limits normal measuring 3.0 cm.

Mediastinum/Nodes: Esophagus is unremarkable. Prominent and mildly
enlarged right peritracheal lymph nodes measuring up to 11 mm (image
48; series 6). Additionally, within the right peritracheal location
there is prominent soft tissue (image 82; series 6) measuring 1.9 cm
in diameter, potentially secondary to on opacified azygos.

Lungs/Pleura: Central airways are patent. Moderate layering right
pleural effusion. Subpleural consolidation within the right lower
lobe. Patchy consolidation within the lingula. No pneumothorax.

Upper Abdomen: No acute process.

Musculoskeletal: Thoracic spine degenerative changes. No aggressive
or acute appearing osseous lesions.

Review of the MIP images confirms the above findings.
IMPRESSION: Findings compatible with acute pulmonary embolus. There appears to
be new thrombus within the distal right middle lobe pulmonary
arteries and lingular pulmonary arteries. Minimal residual thrombus
within the right lower lobe pulmonary arteries.

Positive for acute PE with CT evidence of right heart strain (RV/LV
Ratio = 1.1) consistent with at least submassive (intermediate risk)
PE. The presence of right heart strain has been associated with an
increased risk of morbidity and mortality. Please activate Code PE
by paging [phone].

Moderate layering right pleural effusion. Subpleural consolidation
within the right lower lobe as well as patchy consolidation within
the lingula which may represent atelectasis or infection.

Prominent mildly enlarged right peritracheal lymph nodes.
Additionally there is prominent soft tissue at the expected location
of the azygos, potentially representing non-opacified azygos. Lymph
nodes may be reactive in etiology.

Given multitude of pulmonary findings, recommend follow-up chest CT
in 2- 3 months to assess for interval resolution.

Ascending thoracic aorta measures 4.3 cm. Recommend annual imaging
followup by CTA or MRA. This recommendation follows 1888
ACCF/AHA/AATS/ACR/ASA/SCA/MAYERS/MIRISLAV/SHIKHA/MATARECO Guidelines for the
Diagnosis and Management of Patients with Thoracic Aortic Disease.
Circulation. 1888; 121: [id number]

Critical Value/emergent results were called by telephone at the time
of interpretation on 02/26/2017 at [DATE] to Dr. ISAAC KWAME
JENAE , who verbally acknowledged these results.

## 2019-02-06 ENCOUNTER — Other Ambulatory Visit: Payer: Self-pay | Admitting: Allergy and Immunology

## 2019-07-18 ENCOUNTER — Encounter: Payer: Self-pay | Admitting: Allergy and Immunology

## 2019-07-18 ENCOUNTER — Ambulatory Visit (INDEPENDENT_AMBULATORY_CARE_PROVIDER_SITE_OTHER): Payer: Self-pay | Admitting: Allergy and Immunology

## 2019-07-18 ENCOUNTER — Other Ambulatory Visit: Payer: Self-pay

## 2019-07-18 VITALS — BP 146/90 | HR 69 | Temp 98.2°F | Resp 18 | Ht 72.5 in | Wt 232.0 lb

## 2019-07-18 DIAGNOSIS — K219 Gastro-esophageal reflux disease without esophagitis: Secondary | ICD-10-CM

## 2019-07-18 DIAGNOSIS — J4530 Mild persistent asthma, uncomplicated: Secondary | ICD-10-CM

## 2019-07-18 DIAGNOSIS — J3089 Other allergic rhinitis: Secondary | ICD-10-CM

## 2019-07-18 MED ORDER — ALBUTEROL SULFATE HFA 108 (90 BASE) MCG/ACT IN AERS: 2.0000 | INHALATION_SPRAY | RESPIRATORY_TRACT | 1 refills | Status: DC | PRN

## 2019-07-18 NOTE — Progress Notes (Signed)
[place] [facility] [place] [facility] [place]   Follow-up Note  Referring Provider: Angelina Sheriff, MD  Primary Provider: Angelina Sheriff, MD Date of Office Visit: 07/18/2019  Subjective:   Eric Myers (DOB: 1961-01-30) is a 58 y.o. male who returns to the [facility] [place] on 07/18/2019 in re-evaluation of the following:  HPI: Eric Myers returns to this clinic in reevaluation of asthma and allergic rhinitis and LPR.  His last visit to this clinic was 20 February 2018.  He has really done well since his last visit and has not required a systemic steroid or antibiotic for any type of airway issue.  He has tapered off his Asmanex and his nasal steroid.  He rarely uses a short acting bronchodilator.  Currently he can exert himself without any problem.  On occasion he does get some intermittent nasal congestion that resolves after a few days.  He believes that exposure to scents such as scented candles and consumption of tea precipitates wheezing.  Most of the issue with his throat and his reflux has been eliminated.  He still continues to drink some caffeine but is careful about his caffeine consumption.  He no longer uses any therapy for reflux such as a proton pump inhibitor and H2 receptor blocker.  Allergies as of 07/18/2019      Reactions   Morphine And Related Itching, Other (See Comments)   Rashy red areas and doesn't help pain      Medication List      albuterol 108 (90 Base) MCG/ACT inhaler Commonly known as: ProAir HFA Inhale 2 puffs into the lungs every 4 (four) hours as needed for wheezing or shortness of breath.   mometasone 220 MCG/INH inhaler Commonly known as: Asmanex (30 Metered Doses) Inhale 1 puff into the lungs daily.       Past Medical History:  Diagnosis Date  . Asthma   . Pulmonary embolism [facility])     Past Surgical History:  Procedure Laterality Date  . ADENOIDECTOMY  1990  . ROTATOR CUFF REPAIR    . TONSILLECTOMY  1990     Review of systems negative except as noted in HPI / PMHx or noted below:  Review of Systems  Constitutional: Negative.   HENT: Negative.   Eyes: Negative.   Respiratory: Negative.   Cardiovascular: Negative.   Gastrointestinal: Negative.   Genitourinary: Negative.   Musculoskeletal: Negative.   Skin: Negative.   Neurological: Negative.   Endo/Heme/Allergies: Negative.   Psychiatric/Behavioral: Negative.      Objective:   Vitals:   07/18/19 1601  BP: (!) 146/90  Pulse: 69  Resp: 18  Temp: 98.2 F (36.8 C)  SpO2: 97%   Height: 6' 0.5" (184.2 cm)  Weight: 232 lb (105.2 kg)   Physical Exam Constitutional:      Appearance: He is not diaphoretic.  HENT:     Head: Normocephalic.     Right Ear: Tympanic membrane, ear canal and external ear normal.     Left Ear: Tympanic membrane, ear canal and external ear normal.     Nose: Nose normal. No mucosal edema or rhinorrhea.     Mouth/Throat:     Pharynx: Uvula midline. No oropharyngeal exudate.  Eyes:     Conjunctiva/sclera: Conjunctivae normal.  Neck:     Thyroid: No thyromegaly.     Trachea: Trachea normal. No tracheal tenderness or tracheal deviation.  Cardiovascular:     Rate and Rhythm: Normal rate and regular  rhythm.     Heart sounds: Normal heart sounds, S1 normal and S2 normal. No murmur.  Pulmonary:     Effort: No respiratory distress.     Breath sounds: Normal breath sounds. No stridor. No wheezing or rales.  Lymphadenopathy:     Head:     Right side of head: No tonsillar adenopathy.     Left side of head: No tonsillar adenopathy.     Cervical: No cervical adenopathy.  Skin:    Findings: No erythema or rash.     Nails: There is no clubbing.   Neurological:     Mental Status: He is alert.     Diagnostics:    Spirometry was performed and demonstrated an FEV1 of 3.34 at 81 % of predicted..    Assessment and Plan:   1. Asthma, well controlled, mild persistent   2. Other allergic rhinitis   3.  LPRD (laryngopharyngeal reflux disease)     1.  Allergen avoidance measures - tree, grass, weed, dog  2.  Treat and prevent inflammation:   A. OTC Nasacort - one spray each nostril 1-7 times a week  3. Treat and prevent reflux:   A. Consolidate caffeine consumption as much as possible  4. If needed: Copywriter, advertisingroair Digihaler - 2 inhalations every 4-6 hours  5. Return to clinic in 1 year or earlier if problem  6.  Obtain fall flu vaccine (and COVID vaccine)  Eric Myers appears to be doing very well on his current therapy.  He can restart a nasal steroid as needed and he can continue to be careful about allergen avoidance measures and caffeine consumption.  We gave him a sample of a SABA and we will see him back in this clinic in 1 year or earlier if there is a problem.  Eric SchimkeEric Shlomie Romig, MD Allergy / Immunology [facility]

## 2019-07-18 NOTE — Patient Instructions (Addendum)
  1.  Allergen avoidance measures - tree, grass, weed, dog  2.  Treat and prevent inflammation:   A. OTC Nasacort - one spray each nostril 1-7 times a week  3. Treat and prevent reflux:   A. Consolidate caffeine consumption as much as possible  4. If needed: Air cabin crew - 2 inhalations every 4-6 hours  5. Return to clinic in 1 year or earlier if problem  6.  Obtain fall flu vaccine (and COVID vaccine)

## 2019-07-19 ENCOUNTER — Encounter: Payer: Self-pay | Admitting: Allergy and Immunology

## 2019-07-19 ENCOUNTER — Encounter: Payer: Self-pay | Admitting: *Deleted

## 2020-07-11 ENCOUNTER — Encounter: Payer: Self-pay | Admitting: *Deleted

## 2020-07-11 DIAGNOSIS — I2699 Other pulmonary embolism without acute cor pulmonale: Secondary | ICD-10-CM | POA: Insufficient documentation

## 2020-07-11 DIAGNOSIS — J45909 Unspecified asthma, uncomplicated: Secondary | ICD-10-CM | POA: Insufficient documentation

## 2020-07-17 ENCOUNTER — Encounter: Payer: Self-pay | Admitting: Cardiology

## 2020-07-17 ENCOUNTER — Other Ambulatory Visit: Payer: Self-pay

## 2020-07-17 ENCOUNTER — Ambulatory Visit (INDEPENDENT_AMBULATORY_CARE_PROVIDER_SITE_OTHER): Payer: Self-pay | Admitting: Cardiology

## 2020-07-17 DIAGNOSIS — Z86711 Personal history of pulmonary embolism: Secondary | ICD-10-CM | POA: Insufficient documentation

## 2020-07-17 DIAGNOSIS — R0609 Other forms of dyspnea: Secondary | ICD-10-CM | POA: Insufficient documentation

## 2020-07-17 DIAGNOSIS — R0600 Dyspnea, unspecified: Secondary | ICD-10-CM

## 2020-07-17 DIAGNOSIS — E785 Hyperlipidemia, unspecified: Secondary | ICD-10-CM | POA: Insufficient documentation

## 2020-07-17 DIAGNOSIS — R0789 Other chest pain: Secondary | ICD-10-CM | POA: Insufficient documentation

## 2020-07-17 HISTORY — DX: Dyspnea, unspecified: R06.00

## 2020-07-17 HISTORY — DX: Personal history of pulmonary embolism: Z86.711

## 2020-07-17 HISTORY — DX: Other forms of dyspnea: R06.09

## 2020-07-17 HISTORY — DX: Hyperlipidemia, unspecified: E78.5

## 2020-07-17 NOTE — Progress Notes (Signed)
Cardiology Consultation:    Date:  07/17/2020   ID:  Eric Myers, DOB 1961/06/08, MRN [id number]  PCP:  Eric Saupe, MD  Cardiologist:  [name], MD   Referring MD: Eric Saupe, MD   Chief Complaint  Patient presents with  . Chest Pain  . Fatigue    History of Present Illness:    Eric Myers is a 59 y.o. male who is being seen today for the evaluation of chest pain at the request of [name], MD.  3 years ago he suffered from pulmonary emboli that was results of shoulder surgery.  After that he also seen hematologist Dr. Gilman Buttner.  He is being anticoagulated for short.  Time after that anticoagulation has been discontinued.  He also got history of dyslipidemia.  He does not smoke.  He does have family history of coronary artery disease but not premature.  He was referred to [place] because of chest pain about a month ago he was working and he developed tightness in the chest this sensation lasted for about 20 minutes.  There was no sweating no shortness of breath no radiation associated with this sensation.  Since that time he reports to be weak tired and exhausted.  He blames on the fact that he is getting old as well as some the fact that he gained about 20 pounds within the last few months to year.  Now he does not have any chest pain tightness squeezing pressure burning chest while walking.  Also when he got CT done for his PE he was find to have ascending aortic aneurysm measuring by that time 4.2/4 centimeters.  Past Medical History:  Diagnosis Date  . Acute pulmonary embolism (HCC) 02/26/2017  . Asthma   . Chronic right shoulder pain 08/19/2016  . Pleuritic chest pain 02/26/2017  . Pulmonary embolism (HCC)   . RLL pneumonia 02/26/2017    Past Surgical History:  Procedure Laterality Date  . ROTATOR CUFF REPAIR Right 2018  . TONSILLECTOMY  1990    Current Medications: Current Meds  Medication Sig  . albuterol (PROAIR HFA) 108 (90 Base)  MCG/ACT inhaler Inhale 2 puffs into the lungs every 4 (four) hours as needed for wheezing or shortness of breath.  Marland Kitchen aspirin 325 MG tablet Take 325 mg by mouth daily.  . Multiple Vitamin (MULTIVITAMIN) capsule Take 1 capsule by mouth daily.  . Zinc 50 MG CAPS Take 1 capsule by mouth daily.     Allergies:   Morphine and related   Social History   Socioeconomic History  . Marital status: Married    Spouse name: Not on file  . Number of children: Not on file  . Years of education: Not on file  . Highest education level: Not on file  Occupational History  . Not on file  Tobacco Use  . Smoking status: Never Smoker  . Smokeless tobacco: Never Used  Substance and Sexual Activity  . Alcohol use: No  . Drug use: No  . Sexual activity: Not on file  Other Topics Concern  . Not on file  Social History Narrative  . Not on file   Social Determinants of Health   Financial Resource Strain:   . Difficulty of Paying Living Expenses:   Food Insecurity:   . Worried About Programme researcher, broadcasting/film/video in the Last Year:   . Barista in the Last Year:   Transportation Needs:   . Lack of Transportation (  Medical):   Marland Kitchen Lack of Transportation (Non-Medical):   Physical Activity:   . Days of Exercise per Week:   . Minutes of Exercise per Session:   Stress:   . Feeling of Stress :   Social Connections:   . Frequency of Communication with Friends and Family:   . Frequency of Social Gatherings with Friends and Family:   . Attends Religious Services:   . Active Member of Clubs or Organizations:   . Attends Banker Meetings:   Marland Kitchen Marital Status:      Family History: The patient's family history includes Alzheimer's disease in his father and mother; CAD in his father; Dementia in his father and mother; Hypothyroidism in his mother; Liver cancer in his maternal grandfather. ROS:   Please see the history of present illness.    All 14 point review of systems negative except as described  per history of present illness.  EKGs/Labs/Other Studies Reviewed:    The following studies were reviewed today:   EKG:  EKG is  ordered today.  The ekg ordered today demonstrates normal sinus rhythm, normal P interval, normal QS complex duration morphology no ST segment changes  Recent Labs: No results found for requested labs within last 8760 hours.  Recent Lipid Panel No results found for: CHOL, TRIG, HDL, CHOLHDL, VLDL, LDLCALC, LDLDIRECT  Physical Exam:    VS:  BP 130/90 (BP Location: Left Arm, Patient Position: Sitting, Cuff Size: Large)   Pulse 72   Ht 6' 0.5" (1.842 m)   Wt 247 lb (112 kg)   SpO2 94%   BMI 33.04 kg/m     Wt Readings from Last 3 Encounters:  07/17/20 247 lb (112 kg)  07/18/19 232 lb (105.2 kg)  01/30/18 233 lb 3.2 oz (105.8 kg)     GEN:  Well nourished, well developed in no acute distress HEENT: Normal NECK: No JVD; No carotid bruits LYMPHATICS: No lymphadenopathy CARDIAC: RRR, no murmurs, no rubs, no gallops RESPIRATORY:  Clear to auscultation without rales, wheezing or rhonchi  ABDOMEN: Soft, non-tender, non-distended MUSCULOSKELETAL:  No edema; No deformity  SKIN: Warm and dry NEUROLOGIC:  Alert and oriented x 3 PSYCHIATRIC:  Normal affect   ASSESSMENT:    1. Atypical chest pain   2. History of pulmonary embolism   3. Dyspnea on exertion   4. Dyslipidemia    PLAN:    In order of problems listed above:  1. Atypical chest pain: I will ask him to have an echocardiogram to assess left ventricle ejection fraction.  Also will look at the ascending aorta.  Another reason for echocardiogram is to assess his pulmonary pressures since he does have history of pulmonary emboli.  Because of chest pain I will schedule him to have Lexiscan 1 make sure he does not have any inducible ischemia. 2. History of pulmonary emboli which was provoked.  Overall very low risk for recurrences.  Not anticoagulated. 3. Dyspnea on exertion probably multifactorial  obesity play some role here.  However I will do ischemia work-up as well as look at his left ventricle ejection fraction as well as right ventricle size and function pulmonary pressure. 4. Dyslipidemia: I will ask him to have fasting lipid profile done today then we will calculate his risk then assess need for any therapy. 5. We did talk about healthy lifestyle including exercises as well as good diet which he is trying to do.   Medication Adjustments/Labs and Tests Ordered: Current medicines are reviewed at length  with the patient today.  Concerns regarding medicines are outlined above.  No orders of the defined types were placed in this encounter.  No orders of the defined types were placed in this encounter.   Signed, Georgeanna Lea, MD, [facility]. 07/17/2020 2:18 PM    [facility]

## 2020-07-17 NOTE — Patient Instructions (Signed)
Medication Instructions:  Your physician recommends that you continue on your current medications as directed. Please refer to the Current Medication list given to you today.  *If you need a refill on your cardiac medications before your next appointment, please call your pharmacy*   Lab Work: Your physician recommends that you return for lab work today: lipid   If you have labs (blood work) drawn today and your tests are completely normal, you will receive your results only by: Marland Kitchen MyChart Message (if you have MyChart) OR . A paper copy in the mail If you have any lab test that is abnormal or we need to change your treatment, we will call you to review the results.   Testing/Procedures: Your physician has requested that you have an echocardiogram. Echocardiography is a painless test that uses sound waves to create images of your heart. It provides your doctor with information about the size and shape of your heart and how well your heart's chambers and valves are working. This procedure takes approximately one hour. There are no restrictions for this procedure.    [facility] [address] [place], [place] [address] Phone:  [phone]    Please arrive 15 minutes prior to your appointment time for registration and insurance purposes.  The test will take approximately 3 to 4 hours to complete; you may bring reading material.  If someone comes with you to your appointment, they will need to remain in the main lobby due to limited space in the testing area. **If you are pregnant or breastfeeding, please notify the nuclear lab prior to your appointment**  How to prepare for your Myocardial Perfusion Test: . Do not eat or drink 3 hours prior to your test, except you may have water. . Do not consume products containing caffeine (regular or decaffeinated) 12 hours prior to your test. (ex: coffee, chocolate, sodas, tea). . Do bring a list of your current  medications with you.  If not listed below, you may take your medications as normal. . Do wear comfortable clothes (no dresses or overalls) and walking shoes, tennis shoes preferred (No heels or open toe shoes are allowed). . Do NOT wear cologne, perfume, aftershave, or lotions (deodorant is allowed). . If these instructions are not followed, your test will have to be rescheduled.  Please report to [address] for your test.  If you have questions or concerns about your appointment, you can call the [facility] at [phone].  If you cannot keep your appointment, please provide 24 hours notification to the Nuclear Lab, to avoid a possible $50 charge to your account.    Follow-Up: At [facility], you and your health needs are our priority.  As part of our continuing mission to provide you with exceptional heart care, we have created designated Provider Care Teams.  These Care Teams include your primary Cardiologist (physician) and Advanced Practice Providers (APPs -  Physician Assistants and Nurse Practitioners) who all work together to provide you with the care you need, when you need it.  We recommend signing up for the patient portal called "MyChart".  Sign up information is provided on this After Visit Summary.  MyChart is used to connect with patients for Virtual Visits (Telemedicine).  Patients are able to view lab/test results, encounter notes, upcoming appointments, etc.  Non-urgent messages can be sent to your provider as well.   To learn more about what you can do with MyChart, go  to [email or web address].    Your next appointment:   6 week(s)  The format for your next appointment:   In Person  Provider:   [name], MD   Other Instructions   Echocardiogram An echocardiogram is a procedure that uses painless sound waves (ultrasound) to produce an image of the heart. Images from an echocardiogram can provide important  information about:  Signs of coronary artery disease (CAD).  Aneurysm detection. An aneurysm is a weak or damaged part of an artery wall that bulges out from the normal force of blood pumping through the body.  Heart size and shape. Changes in the size or shape of the heart can be associated with certain conditions, including heart failure, aneurysm, and CAD.  Heart muscle function.  Heart valve function.  Signs of a past heart attack.  Fluid buildup around the heart.  Thickening of the heart muscle.  A tumor or infectious growth around the heart valves. Tell a health care provider about:  Any allergies you have.  All medicines you are taking, including vitamins, herbs, eye drops, creams, and over-the-counter medicines.  Any blood disorders you have.  Any surgeries you have had.  Any medical conditions you have.  Whether you are pregnant or may be pregnant. What are the risks? Generally, this is a safe procedure. However, problems may occur, including:  Allergic reaction to dye (contrast) that may be used during the procedure. What happens before the procedure? No specific preparation is needed. You may eat and drink normally. What happens during the procedure?   An IV tube may be inserted into one of your veins.  You may receive contrast through this tube. A contrast is an injection that improves the quality of the pictures from your heart.  A gel will be applied to your chest.  A wand-like tool (transducer) will be moved over your chest. The gel will help to transmit the sound waves from the transducer.  The sound waves will harmlessly bounce off of your heart to allow the heart images to be captured in real-time motion. The images will be recorded on a computer. The procedure may vary among health care providers and hospitals. What happens after the procedure?  You may return to your normal, everyday life, including diet, activities, and medicines, unless your  health care provider tells you not to do that. Summary  An echocardiogram is a procedure that uses painless sound waves (ultrasound) to produce an image of the heart.  Images from an echocardiogram can provide important information about the size and shape of your heart, heart muscle function, heart valve function, and fluid buildup around your heart.  You do not need to do anything to prepare before this procedure. You may eat and drink normally.  After the echocardiogram is completed, you may return to your normal, everyday life, unless your health care provider tells you not to do that. This information is not intended to replace advice given to you by your health care provider. Make sure you discuss any questions you have with your health care provider. Document Revised: 03/15/2019 Document Reviewed: 12/25/2016 Elsevier Patient Education  2020 Elsevier Inc.   Cardiac Nuclear Scan A cardiac nuclear scan is a test that measures blood flow to the heart when a person is resting and when he or she is exercising. The test looks for problems such as:  Not enough blood reaching a portion of the heart.  The heart muscle not working normally. You may need this test if:  You have heart disease.  You have had abnormal lab results.  You have had heart surgery or a balloon procedure to open up blocked arteries (angioplasty).  You have chest pain.  You have shortness of breath. In this test, a radioactive dye (tracer) is injected into your bloodstream. After the tracer has traveled to your heart, an imaging device is used to measure how much of the tracer is absorbed by or distributed to various areas of your heart. This procedure is usually done at a hospital and takes 2-4 hours. Tell a health care provider about:  Any allergies you have.  All medicines you are taking, including vitamins, herbs, eye drops, creams, and over-the-counter medicines.  Any problems you or family members have  had with anesthetic medicines.  Any blood disorders you have.  Any surgeries you have had.  Any medical conditions you have.  Whether you are pregnant or may be pregnant. What are the risks? Generally, this is a safe procedure. However, problems may occur, including:  Serious chest pain and heart attack. This is only a risk if the stress portion of the test is done.  Rapid heartbeat.  Sensation of warmth in your chest. This usually passes quickly.  Allergic reaction to the tracer. What happens before the procedure?  Ask your health care provider about changing or stopping your regular medicines. This is especially important if you are taking diabetes medicines or blood thinners.  Follow instructions from your health care provider about eating or drinking restrictions.  Remove your jewelry on the day of the procedure. What happens during the procedure?  An IV will be inserted into one of your veins.  Your health care provider will inject a small amount of radioactive tracer through the IV.  You will wait for 20-40 minutes while the tracer travels through your bloodstream.  Your heart activity will be monitored with an electrocardiogram (ECG).  You will lie down on an exam table.  Images of your heart will be taken for about 15-20 minutes.  You may also have a stress test. For this test, one of the following may be done: ? You will exercise on a treadmill or stationary bike. While you exercise, your heart's activity will be monitored with an ECG, and your blood pressure will be checked. ? You will be given medicines that will increase blood flow to parts of your heart. This is done if you are unable to exercise.  When blood flow to your heart has peaked, a tracer will again be injected through the IV.  After 20-40 minutes, you will get back on the exam table and have more images taken of your heart.  Depending on the type of tracer used, scans may need to be repeated 3-4  hours later.  Your IV line will be removed when the procedure is over. The procedure may vary among health care providers and hospitals. What happens after the procedure?  Unless your health care provider tells you otherwise, you may return to your normal schedule, including diet, activities, and medicines.  Unless your health care provider tells you otherwise, you may increase your fluid intake. This will help to flush the contrast dye from your body. Drink enough fluid to keep your urine pale yellow.  Ask your health care provider, or the department that is doing the test: ? When will my results be ready? ? How will I get my results? Summary  A cardiac nuclear scan measures the blood flow to the heart when  a person is resting and when he or she is exercising.  Tell your health care provider if you are pregnant.  Before the procedure, ask your health care provider about changing or stopping your regular medicines. This is especially important if you are taking diabetes medicines or blood thinners.  After the procedure, unless your health care provider tells you otherwise, increase your fluid intake. This will help flush the contrast dye from your body.  After the procedure, unless your health care provider tells you otherwise, you may return to your normal schedule, including diet, activities, and medicines. This information is not intended to replace advice given to you by your health care provider. Make sure you discuss any questions you have with your health care provider. Document Revised: 05/08/2018 Document Reviewed: 05/08/2018 Elsevier Patient Education  2020 [employer].

## 2020-07-18 LAB — LIPID PANEL
Chol/HDL Ratio: 6.2 ratio — ABNORMAL HIGH (ref 0.0–5.0)
Cholesterol, Total: 210 mg/dL — ABNORMAL HIGH (ref 100–199)
HDL: 34 mg/dL — ABNORMAL LOW (ref >39)
LDL Chol Calc (NIH): 154 mg/dL — ABNORMAL HIGH (ref 0–99)
Triglycerides: 118 mg/dL (ref 0–149)
VLDL Cholesterol Cal: 22 mg/dL (ref 5–40)

## 2020-07-21 ENCOUNTER — Other Ambulatory Visit: Payer: Self-pay

## 2020-07-21 ENCOUNTER — Ambulatory Visit (INDEPENDENT_AMBULATORY_CARE_PROVIDER_SITE_OTHER): Payer: Self-pay

## 2020-07-21 DIAGNOSIS — R0600 Dyspnea, unspecified: Secondary | ICD-10-CM

## 2020-07-21 DIAGNOSIS — R0609 Other forms of dyspnea: Secondary | ICD-10-CM

## 2020-07-21 DIAGNOSIS — R0789 Other chest pain: Secondary | ICD-10-CM

## 2020-07-21 LAB — ECHOCARDIOGRAM COMPLETE
Area-P 1/2: 3.99 cm2
S' Lateral: 3.4 cm

## 2020-07-21 MED ORDER — PERFLUTREN LIPID MICROSPHERE: 1.0000 mL | INTRAVENOUS | 0 refills | Status: DC | PRN

## 2020-07-21 NOTE — Progress Notes (Signed)
Complete echocardiogram with contrast has been performed.   Jimmy Zuleica Seith RDCS, RVT 

## 2020-07-23 ENCOUNTER — Telehealth: Payer: Self-pay | Admitting: Cardiology

## 2020-07-23 NOTE — Telephone Encounter (Signed)
Eric Myers is returning Eric Myers's call in regards to his lab results.

## 2020-07-23 NOTE — Telephone Encounter (Signed)
Called him back and gave him test results.

## 2020-07-30 ENCOUNTER — Telehealth (HOSPITAL_COMMUNITY): Payer: Self-pay | Admitting: *Deleted

## 2020-07-30 ENCOUNTER — Encounter (HOSPITAL_COMMUNITY): Payer: Self-pay | Admitting: *Deleted

## 2020-07-30 NOTE — Telephone Encounter (Signed)
Patient given detailed instructions per Myocardial Perfusion Study Information Sheet for the test on 08/05/2020 at 0800. Patient notified to arrive 15 minutes early and that it is imperative to arrive on time for appointment to keep from having the test rescheduled.  If you need to cancel or reschedule your appointment, please call the office within 24 hours of your appointment. . Patient verbalized understanding.Naiyah Klostermann, Adelene Idler Mychart letter sent with instructions

## 2020-08-05 ENCOUNTER — Ambulatory Visit (INDEPENDENT_AMBULATORY_CARE_PROVIDER_SITE_OTHER): Payer: Self-pay

## 2020-08-05 ENCOUNTER — Other Ambulatory Visit: Payer: Self-pay

## 2020-08-05 DIAGNOSIS — R0609 Other forms of dyspnea: Secondary | ICD-10-CM

## 2020-08-05 DIAGNOSIS — R0789 Other chest pain: Secondary | ICD-10-CM

## 2020-08-05 DIAGNOSIS — Z86711 Personal history of pulmonary embolism: Secondary | ICD-10-CM

## 2020-08-05 DIAGNOSIS — R0600 Dyspnea, unspecified: Secondary | ICD-10-CM

## 2020-08-05 LAB — MYOCARDIAL PERFUSION IMAGING
LV dias vol: 86 mL (ref 62–150)
LV sys vol: 33 mL
Peak HR: 86 {beats}/min
Rest HR: 64 {beats}/min
SDS: 3
SRS: 1
SSS: 4
TID: 1

## 2020-08-05 MED ORDER — REGADENOSON 0.4 MG/5ML IV SOLN
0.4000 mg | Freq: Once | INTRAVENOUS | Status: AC
  Administered 2020-08-05: 0.4 mg via INTRAVENOUS

## 2020-08-05 MED ORDER — TECHNETIUM TC 99M TETROFOSMIN IV KIT
30.4000 | PACK | Freq: Once | INTRAVENOUS | Status: AC | PRN
  Administered 2020-08-05: 30.4 via INTRAVENOUS

## 2020-08-05 MED ORDER — TECHNETIUM TC 99M TETROFOSMIN IV KIT
11.0000 | PACK | Freq: Once | INTRAVENOUS | Status: AC | PRN
  Administered 2020-08-05: 11 via INTRAVENOUS

## 2020-08-29 ENCOUNTER — Telehealth: Payer: Self-pay | Admitting: *Deleted

## 2020-08-29 NOTE — Telephone Encounter (Signed)
   [facility] Pre-operative Risk Assessment    Request for surgical clearance:  1. What type of surgery is being performed? Left Shoulder scope, rotator cuff repair   2. When is this surgery scheduled? TBD   3. What type of clearance is required (medical clearance vs. Pharmacy clearance to hold med vs. Both)? Both  4. Are there any medications that need to be held prior to surgery and how long? Aspirin  5. Practice name and name of physician performing surgery? [facility], Dr. Cleda Mccreedy, MD   6. What is your office phone number [phone], ext [phone] [place]    7.   What is your office fax number [phone]  8.   Anesthesia type (None, local, MAC, general) ? Not specified   Roselie Skinner 08/29/2020, 5:00 PM  _________________________________________________________________   (provider comments below)

## 2020-09-01 NOTE — Telephone Encounter (Signed)
   Primary Cardiologist: [name], MD  Chart reviewed as part of pre-operative protocol coverage. Patient was contacted 09/01/2020 in reference to pre-operative risk assessment for pending surgery as outlined below.  Walfred Bettendorf was last seen on 07/17/2020 by Dr. Bing Matter.  Since that day, Tayden Nichelson has had normal echocardiogram and normal myoview without signs of infarct or ischemia. He may hold aspirin for 5 days prior to the procedure and restart as soon as possible after the procedure.   Therefore, based on ACC/AHA guidelines, the patient would be at acceptable risk for the planned procedure without further cardiovascular testing.   I will route this recommendation to the requesting party via Epic fax function and remove from pre-op pool. Please call with questions.  Bonnie Brae, [place] 09/01/2020, 6:01 PM

## 2020-09-03 ENCOUNTER — Ambulatory Visit (INDEPENDENT_AMBULATORY_CARE_PROVIDER_SITE_OTHER): Payer: Self-pay | Admitting: Cardiology

## 2020-09-03 ENCOUNTER — Other Ambulatory Visit: Payer: Self-pay

## 2020-09-03 ENCOUNTER — Encounter: Payer: Self-pay | Admitting: Cardiology

## 2020-09-03 VITALS — BP 148/100 | HR 77 | Ht 73.0 in | Wt 244.0 lb

## 2020-09-03 DIAGNOSIS — I712 Thoracic aortic aneurysm, without rupture: Secondary | ICD-10-CM

## 2020-09-03 DIAGNOSIS — E785 Hyperlipidemia, unspecified: Secondary | ICD-10-CM

## 2020-09-03 DIAGNOSIS — I7121 Aneurysm of the ascending aorta, without rupture: Secondary | ICD-10-CM

## 2020-09-03 DIAGNOSIS — Z86711 Personal history of pulmonary embolism: Secondary | ICD-10-CM

## 2020-09-03 DIAGNOSIS — R0789 Other chest pain: Secondary | ICD-10-CM

## 2020-09-03 HISTORY — DX: Thoracic aortic aneurysm, without rupture: I71.2

## 2020-09-03 HISTORY — DX: Aneurysm of the ascending aorta, without rupture: I71.21

## 2020-09-03 NOTE — Patient Instructions (Signed)

## 2020-09-03 NOTE — Progress Notes (Signed)
Cardiology Office Note:    Date:  09/03/2020   ID:  Eric Myers, DOB 1961-11-18, MRN [id number]  PCP:  [name], MD  Cardiologist:  [name], MD    Referring MD: [name], MD   Chief Complaint  Patient presents with  . Follow-up  . Pre-op Exam    Eric Myers 3 tendons in Left shoulder  I need surgery from a left shoulder  History of Present Illness:    Eric Myers is a 59 y.o. male who was referred to [place] originally because of atypical chest pain.  He does have history of PE years ago which was felt to be related to the shoulder surgery that he got.  He came to our office initially because of atypical chest pain.  Stress test was done which showed no evidence of ischemia, echocardiogram showed preserved left ventricle ejection fraction.  He also does have ascending aortic aneurysm measuring 4.3 cm.  And this is actually stable size I did review his test from 2018 showing exactly the same diameter, at that time he also got CT of his abdomen done which showed no significant aneurysm.  He comes today to discuss this issue doing fine except the fact that he fell down while cutting a tree and broke his left shoulder multiple torn ligaments and he required surgery on the shoulder.  Denies have any dizziness passing out.  Does have a lot of pain in his shoulder  Past Medical History:  Diagnosis Date  . Acute pulmonary embolism (HCC) 02/26/2017  . Asthma   . Chronic right shoulder pain 08/19/2016  . Pleuritic chest pain 02/26/2017  . Pulmonary embolism (HCC)   . RLL pneumonia 02/26/2017    Past Surgical History:  Procedure Laterality Date  . ROTATOR CUFF REPAIR Right 2018  . TONSILLECTOMY  1990    Current Medications: Current Meds  Medication Sig  . albuterol (PROAIR HFA) 108 (90 Base) MCG/ACT inhaler Inhale 2 puffs into the lungs every 4 (four) hours as needed for wheezing or shortness of breath.  Marland Kitchen aspirin 325 MG tablet Take 325 mg by mouth daily.  . IBU  800 MG tablet Take 800 mg by mouth 3 (three) times daily as needed.  . Multiple Vitamin (MULTIVITAMIN) capsule Take 1 capsule by mouth daily.  Marland Kitchen PERFLUTREN LIPID MICROSPHERE Inject 1-10 mLs into the vein as needed.  . Zinc 50 MG CAPS Take 1 capsule by mouth daily.     Allergies:   Morphine and related   Social History   Socioeconomic History  . Marital status: Married    Spouse name: Not on file  . Number of children: Not on file  . Years of education: Not on file  . Highest education level: Not on file  Occupational History  . Not on file  Tobacco Use  . Smoking status: Never Smoker  . Smokeless tobacco: Never Used  Substance and Sexual Activity  . Alcohol use: No  . Drug use: No  . Sexual activity: Not on file  Other Topics Concern  . Not on file  Social History Narrative  . Not on file   Social Determinants of Health   Financial Resource Strain:   . Difficulty of Paying Living Expenses: Not on file  Food Insecurity:   . Worried About Programme researcher, broadcasting/film/video in the Last Year: Not on file  . Ran Out of Food in the Last Year: Not on file  Transportation Needs:   . Lack  of Transportation (Medical): Not on file  . Lack of Transportation (Non-Medical): Not on file  Physical Activity:   . Days of Exercise per Week: Not on file  . Minutes of Exercise per Session: Not on file  Stress:   . Feeling of Stress : Not on file  Social Connections:   . Frequency of Communication with Friends and Family: Not on file  . Frequency of Social Gatherings with Friends and Family: Not on file  . Attends Religious Services: Not on file  . Active Member of Clubs or Organizations: Not on file  . Attends Banker Meetings: Not on file  . Marital Status: Not on file     Family History: The patient's family history includes Alzheimer's disease in his father and mother; CAD in his father; Dementia in his father and mother; Hypothyroidism in his mother; Liver cancer in his maternal  grandfather. ROS:   Please see the history of present illness.    All 14 point review of systems negative except as described per history of present illness  EKGs/Labs/Other Studies Reviewed:      Recent Labs: No results found for requested labs within last 8760 hours.  Recent Lipid Panel    Component Value Date/Time   CHOL 210 (H) 07/18/2020 0805   TRIG 118 07/18/2020 0805   HDL 34 (L) 07/18/2020 0805   CHOLHDL 6.2 (H) 07/18/2020 0805   LDLCALC 154 (H) 07/18/2020 0805    Physical Exam:    VS:  BP (!) 148/100 (BP Location: Right Arm, Patient Position: Sitting, Cuff Size: Normal)   Pulse 77   Ht 6\' 1"  (1.854 m)   Wt 244 lb (110.7 kg)   SpO2 97%   BMI 32.19 kg/m     Wt Readings from Last 3 Encounters:  09/03/20 244 lb (110.7 kg)  08/05/20 247 lb (112 kg)  07/17/20 247 lb (112 kg)     GEN:  Well nourished, well developed in no acute distress HEENT: Normal NECK: No JVD; No carotid bruits LYMPHATICS: No lymphadenopathy CARDIAC: RRR, no murmurs, no rubs, no gallops RESPIRATORY:  Clear to auscultation without rales, wheezing or rhonchi  ABDOMEN: Soft, non-tender, non-distended MUSCULOSKELETAL:  No edema; No deformity  SKIN: Warm and dry LOWER EXTREMITIES: no swelling NEUROLOGIC:  Alert and oriented x 3 PSYCHIATRIC:  Normal affect   ASSESSMENT:    1. Dyslipidemia   2. Atypical chest pain   3. History of pulmonary embolism   4. Ascending aortic aneurysm (HCC) 4.3 cm in 2021    PLAN:    In order of problems listed above:  1. Dyslipidemia had a long discussion about that issue.  I calculate his 10 years risk which is 14%.  And I told him.  He need to be taking cholesterol medication, he prefers not to he prefers to try exercise and good diet.  I am fine with this however when I see him next time we will repeat his cholesterol because he still elevated I will insist on taking some medications. 2. Ascending aortic aneurysm 4.3 cm.  His blood pressure is elevated today  but he is in a lot of pain.  I was ready to put him on some medication however he prefers not to.  He told me straight that he does not like any medication he does have a lot of pain in his left shoulder and he blame his blood pressure on it.  I asked him to check his blood pressure on the regular basis  for next 2 to 3 weeks and send results to me. 3. History of pulmonary emboli: Was that most likely provoked by shoulder surgery couple years ago.  He was anticoagulated therapeutically but no for prevention.   Medication Adjustments/Labs and Tests Ordered: Current medicines are reviewed at length with the patient today.  Concerns regarding medicines are outlined above.  Orders Placed This Encounter  Procedures  . EKG 12-Lead   Medication changes: No orders of the defined types were placed in this encounter.   Signed, Georgeanna Lea, MD, [facility] 09/03/2020 2:17 PM    [facility]

## 2021-01-16 ENCOUNTER — Other Ambulatory Visit: Payer: Self-pay | Admitting: Oncology

## 2021-01-16 ENCOUNTER — Encounter: Payer: Self-pay | Admitting: Oncology

## 2021-01-16 DIAGNOSIS — R911 Solitary pulmonary nodule: Secondary | ICD-10-CM

## 2021-01-16 DIAGNOSIS — I712 Thoracic aortic aneurysm, without rupture, unspecified: Secondary | ICD-10-CM

## 2021-01-16 DIAGNOSIS — Z9189 Other specified personal risk factors, not elsewhere classified: Secondary | ICD-10-CM

## 2021-01-26 ENCOUNTER — Other Ambulatory Visit: Payer: Self-pay

## 2021-02-04 ENCOUNTER — Ambulatory Visit: Payer: Self-pay | Admitting: Cardiology

## 2023-10-05 ENCOUNTER — Telehealth: Payer: Self-pay

## 2023-10-05 NOTE — Telephone Encounter (Signed)
   Name: Azrael Diallo  DOB: 29-Jan-1961  MRN: [id number]  Primary Cardiologist: [name], MD  Chart reviewed as part of pre-operative protocol coverage. Because of Raye Whedbee's past medical history and time since last visit, he will require a follow-up in-office visit in order to better assess preoperative cardiovascular risk.  Pre-op covering staff: - Please schedule appointment and call patient to inform them. If patient already had an upcoming appointment within acceptable timeframe, please add "pre-op clearance" to the appointment notes so provider is aware. - Please contact requesting surgeon's office via preferred method (i.e, phone, fax) to inform them of need for appointment prior to surgery.     Napoleon Form, Leodis Rains, NP  10/05/2023, 5:05 PM

## 2023-10-05 NOTE — Telephone Encounter (Signed)
Pt is going to need a new pt appt as last seen 08/2020. I will send a message to the scheduling team to see if they can reach out to the pt with a new pt appt.

## 2023-10-05 NOTE — Telephone Encounter (Signed)
   [facility] Pre-operative Risk Assessment    Request for surgical clearance:  What type of surgery is being performed? Right Total Hip replacement    When is this surgery scheduled? TBD   What type of clearance is required (medical clearance vs. Pharmacy clearance to hold med vs. Both)? Both  Are there any medications that need to be held prior to surgery and how long?Not specified   Practice name and name of physician performing surgery? Dr. Stevphen Rochester  at [facility]    What is your office phone number: [phone] ext [phone]    7.   What is your office fax number: [phone]  8.   Anesthesia type (None, local, MAC, general) ? Spinal Anesthesia    Eric Myers 10/05/2023, 4:46 PM  _________________________________________________________________   (provider comments below)

## 2023-10-07 NOTE — Telephone Encounter (Signed)
Pt has new pt appt 10/14/23 with Dr. Bing Matter. I will update all parties involved.

## 2023-10-14 ENCOUNTER — Ambulatory Visit: Payer: Self-pay | Attending: Cardiology | Admitting: Cardiology

## 2023-10-14 ENCOUNTER — Encounter: Payer: Self-pay | Admitting: Cardiology

## 2023-10-14 VITALS — BP 142/90 | HR 72 | Ht 73.0 in | Wt 226.4 lb

## 2023-10-14 DIAGNOSIS — Z86711 Personal history of pulmonary embolism: Secondary | ICD-10-CM

## 2023-10-14 DIAGNOSIS — R0609 Other forms of dyspnea: Secondary | ICD-10-CM

## 2023-10-14 DIAGNOSIS — E785 Hyperlipidemia, unspecified: Secondary | ICD-10-CM

## 2023-10-14 DIAGNOSIS — I7121 Aneurysm of the ascending aorta, without rupture: Secondary | ICD-10-CM

## 2023-10-14 DIAGNOSIS — R0789 Other chest pain: Secondary | ICD-10-CM

## 2023-10-14 DIAGNOSIS — Z8249 Family history of ischemic heart disease and other diseases of the circulatory system: Secondary | ICD-10-CM

## 2023-10-14 NOTE — Progress Notes (Unsigned)
Cardiology Consultation:    Date:  10/14/2023   ID:  Eric Myers, DOB Apr 28, 1961, MRN [id number]  PCP:  [name], MD  Cardiologist:  [name], MD   Referring MD: [name], MD   Chief Complaint  Patient presents with   Medical Clearance    Right Hip Replacement Dr. Christin Fudge TBD Spinal Anesthesia     History of Present Illness:    Eric Myers is a 62 y.o. male who is being seen today for the evaluation of cardiovascular evaluation at the request of Hiemstra, John F. II, MD. past medical history significant for pulmonary emboli years ago, I did see him in 2021 and he was sent to me because of atypical chest pain and evaluation before shoulder surgery.  That evaluation included echocardiogram which showed enlargement of the ascending aorta measuring 42 mm, investigation of his abdominal aorta did not reveal any significant enlargement, he also had a stress test done in August 2021 which was negative.  He went to surgery with no difficulties.  Additional problem include essential hypertension, dyslipidemia.  Comes today to my office because he required a right hip replacement surgery.  In spite of fact he got hip problem he is able to walk climb stairs.  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  Denies have any palpitations no swelling of lower extremities no dizziness no passing out.  Past Medical History:  Diagnosis Date   Acute pulmonary embolism (HCC) 02/26/2017   Ascending aortic aneurysm (HCC) 4.3 cm in 2021 09/03/2020   Asthma    Chronic right shoulder pain 08/19/2016   Dyslipidemia 07/17/2020   Dyspnea on exertion 07/17/2020   History of pulmonary embolism 07/17/2020   Pleuritic chest pain 02/26/2017   Pulmonary embolism (HCC)    Pulmonary nodule less than 1 cm in diameter with low risk for malignant neoplasm  02/22/2017   Right lower lobe   RLL pneumonia 02/26/2017    Past Surgical History:  Procedure Laterality Date   ROTATOR CUFF REPAIR Right 2018   TONSILLECTOMY  1990    Current Medications: Current Meds  Medication Sig   IBU 800 MG tablet Take 800 mg by mouth 3 (three) times daily as needed for mild pain (pain score 1-3) or moderate pain (pain score 4-6).   Multiple Vitamin (MULTIVITAMIN) capsule Take 1 capsule by mouth daily.   [DISCONTINUED] albuterol (PROAIR HFA) 108 (90 Base) MCG/ACT inhaler Inhale 2 puffs into the lungs every 4 (four) hours as needed for wheezing or shortness of breath.   [DISCONTINUED] aspirin 325 MG tablet Take 325 mg by mouth daily.   [DISCONTINUED] ELIQUIS 2.5 MG TABS tablet Take 2.5 mg by mouth 2 (two) times daily.   [DISCONTINUED] PERFLUTREN LIPID MICROSPHERE Inject 1-10 mLs into the vein as needed.   [DISCONTINUED] Zinc 50 MG CAPS Take 1 capsule by mouth daily.     Allergies:   Morphine and codeine   Social History   Socioeconomic History   Marital status: Married    Spouse name: Not on file   Number of children: Not on file   Years of education: Not on file   Highest education level: Not on file  Occupational History   Not on file  Tobacco Use   Smoking status: Never   Smokeless tobacco: Never  Substance and Sexual Activity  Alcohol use: No   Drug use: No   Sexual activity: Not on file  Other Topics Concern   Not on file  Social History Narrative   Not on file   Social Determinants of Health   Financial Resource Strain: Not on file  Food Insecurity: Not on file  Transportation Needs: Not on file  Physical Activity: Not on file  Stress: Not on file  Social Connections: Not on file     Family History: The patient's family history includes Alzheimer's disease in his father and mother; CAD in his father; Dementia in his father and mother; Hypothyroidism in his mother; Liver cancer in his maternal grandfather. ROS:   Please see the  history of present illness.    All 14 point review of systems negative except as described per history of present illness.  EKGs/Labs/Other Studies Reviewed:    The following studies were reviewed today:   EKG:  EKG Interpretation Date/Time:  Friday October 14 2023 08:34:16 EST Ventricular Rate:  72 PR Interval:  138 QRS Duration:  94 QT Interval:  390 QTC Calculation: 427 R Axis:   7  Text Interpretation: Normal sinus rhythm Normal ECG When compared with ECG of 26-Feb-2017 06:49, No significant change was found Confirmed by [name] [phone]) on 10/14/2023 8:38:29 AM    Recent Labs: No results found for requested labs within last 365 days.  Recent Lipid Panel    Component Value Date/Time   CHOL 210 (H) 07/18/2020 0805   TRIG 118 07/18/2020 0805   HDL 34 (L) 07/18/2020 0805   CHOLHDL 6.2 (H) 07/18/2020 0805   LDLCALC 154 (H) 07/18/2020 0805    Physical Exam:    VS:  BP (!) 142/90 (BP Location: Left Arm, Patient Position: Sitting)   Pulse 72   Ht 6\' 1"  (1.854 m)   Wt 226 lb 6.4 oz (102.7 kg)   SpO2 97%   BMI 29.87 kg/m     Wt Readings from Last 3 Encounters:  10/14/23 226 lb 6.4 oz (102.7 kg)  09/03/20 244 lb (110.7 kg)  08/05/20 247 lb (112 kg)     GEN:  Well nourished, well developed in no acute distress HEENT: Normal NECK: No JVD; No carotid bruits LYMPHATICS: No lymphadenopathy CARDIAC: RRR, no murmurs, no rubs, no gallops RESPIRATORY:  Clear to auscultation without rales, wheezing or rhonchi  ABDOMEN: Soft, non-tender, non-distended MUSCULOSKELETAL:  No edema; No deformity  SKIN: Warm and dry NEUROLOGIC:  Alert and oriented x 3 PSYCHIATRIC:  Normal affect   ASSESSMENT:    1. Dyspnea on exertion   2. Aneurysm of ascending aorta without rupture (HCC)   3. Atypical chest pain   4. Dyslipidemia   5. History of pulmonary embolism    PLAN:    In order of problems listed above:  Ascending aortic aneurysm.  Required to be checked.  I will  schedule him to have echocardiogram to reassess the size of the aorta. Overall risk assessment for coronary disease.  I will schedule him to have a calcium score to help me to determine how aggressive we need to be with management of his cholesterol.  I did review K PN which show me his LDL at 124 HDL 34 however this is from year ago.  Will do fasting lipid profile today. Dyslipidemia: Fasting lipid profile will be done. Cardiovascular preop evaluation.  Will wait for echocardiogram as well as calcium score.  If calcium score is significantly elevated we may be forced to proceed either  with stress testing or coronary CT angio.  If calcium score is low then he should be able to proceed with surgery with no reservations.   Medication Adjustments/Labs and Tests Ordered: Current medicines are reviewed at length with the patient today.  Concerns regarding medicines are outlined above.  Orders Placed This Encounter  Procedures   EKG 12-Lead   No orders of the defined types were placed in this encounter.   Signed, Georgeanna Lea, MD, [facility]. 10/14/2023 8:50 AM    [facility]

## 2023-10-14 NOTE — Patient Instructions (Signed)
Medication Instructions:  Your physician recommends that you continue on your current medications as directed. Please refer to the Current Medication list given to you today.  *If you need a refill on your cardiac medications before your next appointment, please call your pharmacy*   Lab Work: Lipid panel - today If you have labs (blood work) drawn today and your tests are completely normal, you will receive your results only by: MyChart Message (if you have MyChart) OR A paper copy in the mail If you have any lab test that is abnormal or we need to change your treatment, we will call you to review the results.   Testing/Procedures: We will order CT coronary calcium score. It will cost $99.00 and is not covered by insurance.  Please call to schedule.     [facility] [address] [place], [place] [address] [phone]    Your physician has requested that you have an echocardiogram. Echocardiography is a painless test that uses sound waves to create images of your heart. It provides your doctor with information about the size and shape of your heart and how well your heart's chambers and valves are working. This procedure takes approximately one hour. There are no restrictions for this procedure. Please do NOT wear cologne, perfume, aftershave, or lotions (deodorant is allowed). Please arrive 15 minutes prior to your appointment time.  Please note: We ask at that you not bring children with you during ultrasound (echo/ vascular) testing. Due to room size and safety concerns, children are not allowed in the ultrasound rooms during exams. Our front office staff cannot provide observation of children in our lobby area while testing is being conducted. An adult accompanying a patient to their appointment will only be allowed in the ultrasound room at the discretion of the ultrasound technician under special circumstances. We apologize for any  inconvenience.      Follow-Up: At [facility], you and your health needs are our priority.  As part of our continuing mission to provide you with exceptional heart care, we have created designated Provider Care Teams.  These Care Teams include your primary Cardiologist (physician) and Advanced Practice Providers (APPs -  Physician Assistants and Nurse Practitioners) who all work together to provide you with the care you need, when you need it.  We recommend signing up for the patient portal called "MyChart".  Sign up information is provided on this After Visit Summary.  MyChart is used to connect with patients for Virtual Visits (Telemedicine).  Patients are able to view lab/test results, encounter notes, upcoming appointments, etc.  Non-urgent messages can be sent to your provider as well.   To learn more about what you can do with MyChart, go to [email or web address].    Your next appointment:   6 month(s)  The format for your next appointment:   In Person  Provider:   [name], MD    Other Instructions NA

## 2023-10-17 LAB — LIPID PANEL
Chol/HDL Ratio: 5.7 ratio — ABNORMAL HIGH (ref 0.0–5.0)
Cholesterol, Total: 204 mg/dL — ABNORMAL HIGH (ref 100–199)
HDL: 36 mg/dL — ABNORMAL LOW (ref >39)
LDL Chol Calc (NIH): 147 mg/dL — ABNORMAL HIGH (ref 0–99)
Triglycerides: 117 mg/dL (ref 0–149)
VLDL Cholesterol Cal: 21 mg/dL (ref 5–40)

## 2023-10-19 ENCOUNTER — Ambulatory Visit (HOSPITAL_BASED_OUTPATIENT_CLINIC_OR_DEPARTMENT_OTHER)
Admission: RE | Admit: 2023-10-19 | Discharge: 2023-10-19 | Disposition: A | Discharge status: Home / Self Care | Payer: Self-pay | Source: Ambulatory Visit | Attending: Cardiology | Admitting: Cardiology

## 2023-10-19 DIAGNOSIS — Z8249 Family history of ischemic heart disease and other diseases of the circulatory system: Secondary | ICD-10-CM | POA: Insufficient documentation

## 2023-11-17 ENCOUNTER — Telehealth: Payer: Self-pay

## 2023-11-17 ENCOUNTER — Ambulatory Visit: Payer: Self-pay | Attending: Cardiology

## 2023-11-17 DIAGNOSIS — R0609 Other forms of dyspnea: Secondary | ICD-10-CM

## 2023-11-17 LAB — ECHOCARDIOGRAM COMPLETE
Area-P 1/2: 3.37 cm2
S' Lateral: 3.3 cm

## 2023-11-17 NOTE — Telephone Encounter (Signed)
-----   Message from [name] sent at 11/09/2023  8:39 AM EST ----- Coronary calcium score is elevated but only mildly, enlarged aorta between 45 to 47 mm.  Will continue monitoring.

## 2023-11-17 NOTE — Telephone Encounter (Signed)
-----   Message from [name] sent at 10/20/2023 11:34 AM EST ----- Will wait for coronary CT angio to make decision about cholesterol which is still elevated

## 2023-11-17 NOTE — Telephone Encounter (Signed)
LM to return my call. 

## 2023-11-18 ENCOUNTER — Telehealth: Payer: Self-pay

## 2023-11-18 NOTE — Telephone Encounter (Signed)
Patient notified of results and verbalized understanding.  

## 2023-11-18 NOTE — Telephone Encounter (Signed)
-----   Message from [facility] Andreina Outten P sent at 11/17/2023  1:27 PM EST -----  ----- Message ----- From: Georgeanna Lea, MD Sent: 10/20/2023  11:34 AM EST To: Neena Rhymes, RN  Will wait for coronary CT angio to make decision about cholesterol which is still elevated

## 2023-11-25 ENCOUNTER — Telehealth: Payer: Self-pay

## 2023-11-25 NOTE — Telephone Encounter (Signed)
-----   Message from [name] sent at 11/22/2023  8:59 PM EST ----- Should be fine to proceed with surgery ----- Message ----- From: Heywood Bene, CMA Sent: 11/18/2023   8:14 AM EST To: Georgeanna Lea, MD  To be answer upon your arrival on Monday. Per patient states test completed was to determine if the patient can proceed with surgery, please advise ----- Message ----- From: Heywood Bene, CMA Sent: 11/17/2023   1:28 PM EST To: Heywood Bene, CMA   ----- Message ----- From: Georgeanna Lea, MD Sent: 11/09/2023   8:39 AM EST To: Neena Rhymes, RN  Coronary calcium score is elevated but only mildly, enlarged aorta between 45 to 47 mm.  Will continue monitoring.

## 2023-11-25 NOTE — Telephone Encounter (Signed)
Left detail message on VM and my chart

## 2023-12-08 NOTE — Telephone Encounter (Signed)
 I s/w the pt and his wife and assured them that pre op APP today Rosaline Bane, NP has faxed over clearance notes to Dr. Edna. Pt's wife said she wanted to make sure that the notes reflect that the pt has been cleared, as she states Burnard, surgery scheduler said she can see the results but needs it to be documented he has been cleared. I assured the pt's wife yes, notes reflect pt has been cleared. Pt's wife thanked me for the call back today.   I assured her that I will also reach out to surgery scheduler Burnard High to assure notes have been received.

## 2023-12-08 NOTE — Telephone Encounter (Signed)
 Patient's wife is returning call. She states the requesting office needs documentation that the patient is cleared.

## 2023-12-08 NOTE — Telephone Encounter (Signed)
 Please notify pt that clearance has been faxed today.

## 2023-12-08 NOTE — Telephone Encounter (Signed)
   Primary Cardiologist: Lamar Fitch, MD  Chart reviewed as part of pre-operative protocol coverage. Given past medical history and time since last visit, based on ACC/AHA guidelines, Eric Myers would be at acceptable risk for the planned procedure without further cardiovascular testing.   Patient was advised that if he develops new symptoms prior to surgery to contact our office to arrange a follow-up appointment.  He verbalized understanding.  I will route this recommendation to the requesting party via Epic fax function and remove from pre-op pool.  Please call with questions.  Rosaline EMERSON Bane, NP-C 12/08/2023, 9:13 AM [address] Office [phone] Fax [phone]

## 2024-01-06 ENCOUNTER — Telehealth: Payer: Self-pay | Admitting: Cardiology

## 2024-01-06 NOTE — Telephone Encounter (Signed)
Spoke with souse. Left Eliquis coupon up front at [facility] office for them to pick up. She verbalized understanding and had no further questions.

## 2024-01-06 NOTE — Telephone Encounter (Signed)
Pt c/o medication issue:  1. Name of Medication:  Eliquis   2. How are you currently taking this medication (dosage and times per day)?   3. Are you having a reaction (difficulty breathing--STAT)?   4. What is your medication issue?   Wife states patient is scheduled for a total hip replacement this morning and he needs to take Eliquis, but it will cost him $1000/1 mo supply. She states they were told that there may be a coupon at the office for Eliquis. Please advise.

## 2024-01-17 ENCOUNTER — Telehealth: Payer: Self-pay

## 2024-01-17 NOTE — Telephone Encounter (Signed)
-----   Message from [name] sent at 11/22/2023  8:34 PM EST ----- Echocardiogram showed preserved left ventricle ejection fraction, mild mitral valve regurgitation, ascending aorta measuring 45 mm.  Medical therapy for now

## 2024-01-17 NOTE — Telephone Encounter (Signed)
Left vm to return call.

## 2024-08-31 ENCOUNTER — Telehealth: Payer: Self-pay | Admitting: Gastroenterology

## 2024-08-31 NOTE — Telephone Encounter (Signed)
Ok to schedule next available.  Thanks

## 2024-08-31 NOTE — Telephone Encounter (Signed)
 Dr. Nandigam,  Will you clarify OV or colonoscopy?  Thank you.

## 2024-08-31 NOTE — Telephone Encounter (Signed)
 Good afternoon Dr. Shila,   Patients wife called stating that has he his previous gastro records drom Dr. Anette sent over to us  for a transfer of care. She states that she would like patient to be seen with you to have a colonoscopy before the end of the year because she is a  patient of yours. Requesting a transfer of care due to Dr. Anette retired. Patients last colonoscopy was in 2017. Records are under the media tab for you to review.   Will you please review and advise on scheduling?  Thank you.

## 2024-09-12 NOTE — Telephone Encounter (Signed)
 Okay to schedule direct colonoscopy if he has no active GI symptoms

## 2024-09-13 ENCOUNTER — Encounter: Payer: Self-pay | Admitting: Gastroenterology

## 2024-11-05 ENCOUNTER — Ambulatory Visit: Payer: Self-pay | Admitting: Gastroenterology

## 2024-11-05 NOTE — Progress Notes (Deleted)
 Eric Myers [id number] 1961/04/02   Chief Complaint:  Referring Provider: Fearing Norleen PHEBE PONCE, MD Primary GI MD: Dr. Shila  HPI: Eric Myers is a 63 y.o. male with past medical history of ascending aortic aneurysm, pulmonary embolism 2018, asthma, dyslipidemia, who presents today for a complaint of *** .    Seen by cardiology 10/14/2023 for preop clearance.  Echocardiogram was ordered at that time to reevaluate ascending aortic aneurysm.  Scheduled for calcium scoring as well. Ultimately was cleared to proceed with surgery.   Discussed the use of AI scribe software for clinical note transcription with the patient, who gave verbal consent to proceed.  History of Present Illness       Previous GI Procedures/Imaging   Colonoscopy 2017 2 tubular adenomas removed Recall 5 years   Past Medical History:  Diagnosis Date   Acute pulmonary embolism (HCC) 02/26/2017   Ascending aortic aneurysm (HCC) 4.3 cm in 2021 09/03/2020   Asthma    Chronic right shoulder pain 08/19/2016   Dyslipidemia 07/17/2020   Dyspnea on exertion 07/17/2020   History of pulmonary embolism 07/17/2020   Pleuritic chest pain 02/26/2017   Pulmonary embolism (HCC)    Pulmonary nodule less than 1 cm in diameter with low risk for malignant neoplasm 02/22/2017   Right lower lobe   RLL pneumonia 02/26/2017    Past Surgical History:  Procedure Laterality Date   ROTATOR CUFF REPAIR Right 2018   TONSILLECTOMY  1990    Current Outpatient Medications  Medication Sig Dispense Refill   IBU 800 MG tablet Take 800 mg by mouth 3 (three) times daily as needed for mild pain (pain score 1-3) or moderate pain (pain score 4-6).     Multiple Vitamin (MULTIVITAMIN) capsule Take 1 capsule by mouth daily.     No current facility-administered medications for this visit.    Allergies as of 11/05/2024 - Review Complete 10/14/2023  Allergen Reaction Noted   Morphine  and codeine Itching and Other (See Comments)  02/26/2017    Family History  Problem Relation Age of Onset   Alzheimer's disease Mother    Dementia Mother    Hypothyroidism Mother    Alzheimer's disease Father    Dementia Father    CAD Father    Liver cancer Maternal Grandfather     Social History   Tobacco Use   Smoking status: Never   Smokeless tobacco: Never  Substance Use Topics   Alcohol use: No   Drug use: No     Review of Systems:    Constitutional: No weight loss, fever, chills, weakness or fatigue Eyes: No change in vision Ears, Nose, Throat:  No change in hearing or congestion Skin: No rash or itching Cardiovascular: No chest pain, chest pressure or palpitations   Respiratory: No SOB or cough Gastrointestinal: See HPI and otherwise negative Genitourinary: No dysuria or change in urinary frequency Neurological: No headache, dizziness or syncope Musculoskeletal: No new muscle or joint pain Hematologic: No bleeding or bruising    Physical Exam:  Vital signs: There were no vitals taken for this visit.  Constitutional: NAD, Well developed, Well nourished, alert and cooperative Head:  Normocephalic and atraumatic.  Eyes: No scleral icterus. Conjunctiva pink. Mouth: No oral lesions. Respiratory: Respirations even and unlabored. Lungs clear to auscultation bilaterally.  No wheezes, crackles, or rhonchi.  Cardiovascular:  Regular rate and rhythm. No murmurs. No peripheral edema. Gastrointestinal:  Soft, nondistended, nontender. No rebound or guarding. Normal bowel sounds. No appreciable masses or hepatomegaly. Rectal:  Not performed.  Neurologic:  Alert and oriented x4;  grossly normal neurologically.  Skin:   Dry and intact without significant lesions or rashes. Psychiatric: Oriented to person, place and time. Demonstrates good judgement and reason without abnormal affect or behaviors.   RELEVANT LABS AND IMAGING: CBC    Component Value Date/Time   WBC 9.2 02/27/2017 0246   RBC 4.97 02/27/2017 0246    HGB 14.0 02/27/2017 0246   HCT 41.7 02/27/2017 0246   PLT 250 02/27/2017 0246   MCV 83.9 02/27/2017 0246   MCH 28.2 02/27/2017 0246   MCHC 33.6 02/27/2017 0246   RDW 12.9 02/27/2017 0246    CMP     Component Value Date/Time   NA 133 (L) 02/27/2017 0246   K 4.0 02/27/2017 0246   CL 98 (L) 02/27/2017 0246   CO2 25 02/27/2017 0246   GLUCOSE 116 (H) 02/27/2017 0246   BUN 21 (H) 02/27/2017 0246   CREATININE 0.95 02/27/2017 0246   CALCIUM 9.1 02/27/2017 0246   PROT 6.6 02/27/2017 0246   ALBUMIN 3.1 (L) 02/27/2017 0246   AST 39 02/27/2017 0246   ALT 36 02/27/2017 0246   ALKPHOS 53 02/27/2017 0246   BILITOT 0.8 02/27/2017 0246   GFRNONAA >60 02/27/2017 0246   GFRAA >60 02/27/2017 0246   Echocardiogram 11/17/2023 1. Left ventricular ejection fraction, by estimation, is 55 to 60% . The left ventricle has normal function. The left ventricle has no regional wall motion abnormalities. Left ventricular diastolic parameters were normal. The average left ventricular global longitudinal strain is 19. 4 % . The global longitudinal strain is normal.  2. Right ventricular systolic function is normal. The right ventricular size is normal. There is normal pulmonary artery systolic pressure.  3. The mitral valve is degenerative. Mild mitral valve regurgitation. No evidence of mitral stenosis.  4. The aortic valve is tricuspid. Aortic valve regurgitation is mild. Aortic valve sclerosis is present, with no evidence of aortic valve stenosis.  5. There is severe dilatation of the ascending aorta and of the aortic root, measuring 45 mm.  6. The inferior vena cava is normal in size with greater than 50% respiratory variability, suggesting right atrial pressure of 3 mmHg.  Assessment/Plan:    Assessment and Plan Assessment & Plan    Schedule repeat colonoscopy Add EGD? May need cardiac clearance Need to get updated labs   Camie Furbish, PA-C [facility] 11/05/2024, 9:31 AM  Patient  Care Team: Dottie Norleen PHEBE PONCE, MD as PCP - General (Family Medicine) Bernie Lamar PARAS, MD as PCP - Cardiology (Cardiology)

## 2024-11-07 ENCOUNTER — Other Ambulatory Visit (INDEPENDENT_AMBULATORY_CARE_PROVIDER_SITE_OTHER): Payer: Self-pay

## 2024-11-07 ENCOUNTER — Other Ambulatory Visit: Payer: Self-pay

## 2024-11-07 ENCOUNTER — Ambulatory Visit: Payer: Self-pay | Admitting: Gastroenterology

## 2024-11-07 ENCOUNTER — Encounter: Payer: Self-pay | Admitting: Gastroenterology

## 2024-11-07 VITALS — BP 136/82 | HR 98 | Ht 73.0 in | Wt 248.4 lb

## 2024-11-07 DIAGNOSIS — Z860101 Personal history of adenomatous and serrated colon polyps: Secondary | ICD-10-CM

## 2024-11-07 DIAGNOSIS — R0989 Other specified symptoms and signs involving the circulatory and respiratory systems: Secondary | ICD-10-CM

## 2024-11-07 DIAGNOSIS — J45909 Unspecified asthma, uncomplicated: Secondary | ICD-10-CM

## 2024-11-07 DIAGNOSIS — R12 Heartburn: Secondary | ICD-10-CM

## 2024-11-07 LAB — CBC WITH DIFFERENTIAL/PLATELET
Basophils Absolute: 0 K/uL (ref 0.0–0.1)
Basophils Relative: 0.3 % (ref 0.0–3.0)
Eosinophils Absolute: 0.1 K/uL (ref 0.0–0.7)
Eosinophils Relative: 1 % (ref 0.0–5.0)
HCT: 50 % (ref 39.0–52.0)
Hemoglobin: 16.8 g/dL (ref 13.0–17.0)
Lymphocytes Relative: 27.3 % (ref 12.0–46.0)
Lymphs Abs: 2.2 K/uL (ref 0.7–4.0)
MCHC: 33.7 g/dL (ref 30.0–36.0)
MCV: 83.8 fl (ref 78.0–100.0)
Monocytes Absolute: 0.6 K/uL (ref 0.1–1.0)
Monocytes Relative: 7.3 % (ref 3.0–12.0)
Neutro Abs: 5.1 K/uL (ref 1.4–7.7)
Neutrophils Relative %: 64.1 % (ref 43.0–77.0)
Platelets: 203 K/uL (ref 150.0–400.0)
RBC: 5.97 Mil/uL — ABNORMAL HIGH (ref 4.22–5.81)
RDW: 13.6 % (ref 11.5–15.5)
WBC: 7.9 K/uL (ref 4.0–10.5)

## 2024-11-07 LAB — BASIC METABOLIC PANEL WITH GFR
BUN: 15 mg/dL (ref 6–23)
CO2: 27 meq/L (ref 19–32)
Calcium: 9.7 mg/dL (ref 8.4–10.5)
Chloride: 100 meq/L (ref 96–112)
Creatinine, Ser: 0.94 mg/dL (ref 0.40–1.50)
GFR: 87.67 mL/min (ref >60.00)
Glucose, Bld: 87 mg/dL (ref 70–99)
Potassium: 4.5 meq/L (ref 3.5–5.1)
Sodium: 137 meq/L (ref 135–145)

## 2024-11-07 MED ORDER — NA SULFATE-K SULFATE-MG SULF 17.5-3.13-1.6 GM/177ML PO SOLN: 1.0000 | ORAL | 0 refills | Status: DC

## 2024-11-07 NOTE — Patient Instructions (Signed)
 Your provider has requested that you go to the basement level for lab work before leaving today. Press B on the elevator. The lab is located at the first door on the left as you exit the elevator.  We have sent the following medications to your pharmacy for you to pick up at your convenience: Suprep   You have been scheduled for a colonoscopy. Please follow written instructions given to you at your visit today.   If you use inhalers (even only as needed), please bring them with you on the day of your procedure.  DO NOT TAKE 7 DAYS PRIOR TO TEST- Trulicity (dulaglutide) Ozempic, Wegovy (semaglutide) Mounjaro, Zepbound (tirzepatide) Bydureon Bcise (exanatide extended release)  DO NOT TAKE 1 DAY PRIOR TO YOUR TEST Rybelsus (semaglutide) Adlyxin (lixisenatide) Victoza (liraglutide) Byetta (exanatide) ____________________________________________________________   Due to recent changes in healthcare laws, you may see the results of your imaging and laboratory studies on MyChart before your provider has had a chance to review them.  We understand that in some cases there may be results that are confusing or concerning to you. Not all laboratory results come back in the same time frame and the provider may be waiting for multiple results in order to interpret others.  Please give us  48 hours in order for your provider to thoroughly review all the results before contacting the office for clarification of your results.   _______________________________________________________  If your blood pressure at your visit was 140/90 or greater, please contact your primary care physician to follow up on this.  _______________________________________________________  If you are age 61 or older, your body mass index should be between 23-30. Your Body mass index is 32.77 kg/m. If this is out of the aforementioned range listed, please consider follow up with your Primary Care Provider.  If you are age 79  or younger, your body mass index should be between 19-25. Your Body mass index is 32.77 kg/m. If this is out of the aformentioned range listed, please consider follow up with your Primary Care Provider.   ________________________________________________________  The [facility] providers would like to encourage you to use MYCHART to communicate with providers for non-urgent requests or questions.  Due to long hold times on the telephone, sending your provider a message by [facility] may be a faster and more efficient way to get a response.  Please allow 48 business hours for a response.  Please remember that this is for non-urgent requests.  _______________________________________________________  [facility] is using a team-based approach to care.  Your team is made up of your doctor and two to three APPS. Our APPS (Nurse Practitioners and Physician Assistants) work with your physician to ensure care continuity for you. They are fully qualified to address your health concerns and develop a treatment plan. They communicate directly with your gastroenterologist to care for you. Seeing the Advanced Practice Practitioners on your physician's team can help you by facilitating care more promptly, often allowing for earlier appointments, access to diagnostic testing, procedures, and other specialty referrals.   Thank you for choosing me and  Gastro[facility].

## 2024-11-07 NOTE — Progress Notes (Signed)
 Eric Myers [id number] 10-25-1961   Chief Complaint: Discuss EGD/colonoscopy  Referring Provider: Fearing Norleen PHEBE PONCE, MD Primary GI MD: Dr. Shila   HPI: Eric Myers is a 63 y.o. male with past medical history of ascending aortic aneurysm, pulmonary embolism 2018, asthma, dyslipidemia who presents today to discuss colonoscopy and EGD.    Previously saw Dr. Towana.  Last colonoscopy 2017 with 2 TAs removed and recall recommended in 5 years.   Seen by cardiology 10/14/2023 for preop clearance.  Echocardiogram was ordered at that time to reevaluate ascending aortic aneurysm.  Scheduled for calcium scoring as well. Ultimately was cleared to proceed with surgery.   Discussed the use of AI scribe software for clinical note transcription with the patient, who gave verbal consent to proceed.  History of Present Illness Eric Myers is a 63 year old male who presents for a follow-up colonoscopy and to discuss EGD. He was previously followed by Dr. Towana for his gastrointestinal care.  Colorectal neoplasia surveillance - Due for repeat colonoscopy; last procedure in 2017 revealed a couple of polyps. - Maintains regular bowel habits with no changes. - No hematochezia or melena. - No abdominal pain.  Throat clearing and upper gastrointestinal symptoms - Frequent throat clearing, persistent for years - Intermittent acid reflux/heartburn; episodes occur infrequently, typically after consuming spicy foods. - Severe episode of heartburn in the past 6-8 months, relieved with Tums. - No dysphagia or sensation of food impaction. - Occasional coughing after swallowing, with sensation of food 'going down the wrong way'. - No history of esophageal or stomach cancer in family, but mother had esophageal dilation later in life.  Dietary modifications - Reduced soft drink consumption; primarily drinks water. - Consumes one cup of coffee in the morning.  Respiratory symptoms and  allergies - Mild asthma triggered by exposure to perfumes and scented candles. - Allergic to dogs  - No significant chest pain or shortness of breath.    Previous GI Procedures/Imaging   Colonoscopy 2017 2 tubular adenomas removed Recall 5 years   Past Medical History:  Diagnosis Date   Acute pulmonary embolism (HCC) 02/26/2017   Ascending aortic aneurysm (HCC) 4.3 cm in 2021 09/03/2020   Asthma    Chronic right shoulder pain 08/19/2016   Dyslipidemia 07/17/2020   Dyspnea on exertion 07/17/2020   History of pulmonary embolism 07/17/2020   Pleuritic chest pain 02/26/2017   Pulmonary embolism (HCC)    Pulmonary nodule less than 1 cm in diameter with low risk for malignant neoplasm 02/22/2017   Right lower lobe   RLL pneumonia 02/26/2017    Past Surgical History:  Procedure Laterality Date   ROTATOR CUFF REPAIR Right 2018   TONSILLECTOMY  1990    Current Outpatient Medications  Medication Sig Dispense Refill   IBU 800 MG tablet Take 800 mg by mouth 3 (three) times daily as needed for mild pain (pain score 1-3) or moderate pain (pain score 4-6).     Multiple Vitamin (MULTIVITAMIN) capsule Take 1 capsule by mouth daily.     No current facility-administered medications for this visit.    Allergies as of 11/07/2024 - Review Complete 11/07/2024  Allergen Reaction Noted   Morphine  and codeine Itching and Other (See Comments) 02/26/2017    Family History  Problem Relation Age of Onset   Alzheimer's disease Mother    Dementia Mother    Hypothyroidism Mother    Alzheimer's disease Father    Dementia Father    CAD Father  Liver cancer Maternal Grandfather     Social History   Tobacco Use   Smoking status: Never   Smokeless tobacco: Never  Substance Use Topics   Alcohol use: No   Drug use: No     Review of Systems:    Constitutional: No fever, chills, weakness Cardiovascular: No chest pain Respiratory: No SOB Gastrointestinal: See HPI and otherwise negative    Physical Exam:  Vital signs: BP 136/82   Pulse 98   Ht 6' 1 (1.854 m)   Wt 248 lb 6 oz (112.7 kg)   SpO2 97%   BMI 32.77 kg/m   Constitutional: Pleasant, obese male in NAD, alert and cooperative Head:  Normocephalic and atraumatic.  Eyes: No scleral icterus.  Respiratory: Respirations even and unlabored. Lungs clear to auscultation bilaterally.  No wheezes, crackles, or rhonchi.  Cardiovascular:  Regular rate and rhythm. No murmurs. No peripheral edema. Gastrointestinal:  Soft, nondistended, nontender. No rebound or guarding. Normal bowel sounds. No appreciable masses or hepatomegaly. Rectal:  Not performed.  Neurologic:  Alert and oriented x4;  grossly normal neurologically.  Skin:   Dry and intact without significant lesions or rashes. Psychiatric: Oriented to person, place and time. Demonstrates good judgement and reason without abnormal affect or behaviors.   RELEVANT LABS AND IMAGING: CBC    Component Value Date/Time   WBC 9.2 02/27/2017 0246   RBC 4.97 02/27/2017 0246   HGB 14.0 02/27/2017 0246   HCT 41.7 02/27/2017 0246   PLT 250 02/27/2017 0246   MCV 83.9 02/27/2017 0246   MCH 28.2 02/27/2017 0246   MCHC 33.6 02/27/2017 0246   RDW 12.9 02/27/2017 0246    CMP     Component Value Date/Time   NA 133 (L) 02/27/2017 0246   K 4.0 02/27/2017 0246   CL 98 (L) 02/27/2017 0246   CO2 25 02/27/2017 0246   GLUCOSE 116 (H) 02/27/2017 0246   BUN 21 (H) 02/27/2017 0246   CREATININE 0.95 02/27/2017 0246   CALCIUM 9.1 02/27/2017 0246   PROT 6.6 02/27/2017 0246   ALBUMIN 3.1 (L) 02/27/2017 0246   AST 39 02/27/2017 0246   ALT 36 02/27/2017 0246   ALKPHOS 53 02/27/2017 0246   BILITOT 0.8 02/27/2017 0246   GFRNONAA >60 02/27/2017 0246   GFRAA >60 02/27/2017 0246   Echocardiogram 11/17/2023 1. Left ventricular ejection fraction, by estimation, is 55 to 60% . The left ventricle has normal function. The left ventricle has no regional wall motion abnormalities. Left  ventricular diastolic parameters were normal. The average left ventricular global longitudinal strain is 19. 4 % . The global longitudinal strain is normal.  2. Right ventricular systolic function is normal. The right ventricular size is normal. There is normal pulmonary artery systolic pressure.  3. The mitral valve is degenerative. Mild mitral valve regurgitation. No evidence of mitral stenosis.  4. The aortic valve is tricuspid. Aortic valve regurgitation is mild. Aortic valve sclerosis is present, with no evidence of aortic valve stenosis.  5. There is severe dilatation of the ascending aorta and of the aortic root, measuring 45 mm.  6. The inferior vena cava is normal in size with greater than 50% respiratory variability, suggesting right atrial pressure of 3 mmHg.  Assessment/Plan:   Assessment & Plan History of adenomatous colon polyps Due for repeat colonoscopy, last procedure in 2017 revealed 2 tubular adenomas.   - Schedule colonoscopy. I thoroughly discussed the procedure with the patient to include nature of the procedure, alternatives, benefits, and risks (  including but not limited to bleeding, infection, perforation, anesthesia/cardiac/pulmonary complications). Patient verbalized understanding and gave verbal consent to proceed with procedure.  - Get updated labs today: CBC, BMP  Chronic throat clearing Heartburn  Long-standing history of persistent throat clearing.  Has intermittent acid reflux/heartburn.  Has had severe episodes on occasion, though not frequently.  Not on any reflux medications. Has asthma. No prior EGD.  Occasional coughing after swallowing. Family history of esophageal issues.  He would like to have an upper endoscopy along with upcoming colonoscopy.  He is self-pay, where he will be responsible for cost of procedure but would like to proceed.  - Schedule upper endoscopy to be done with colonoscopy. - Consider PPI    Camie Furbish, PA-C [facility] 11/07/2024, 11:03 AM  Patient Care Team: Dottie Norleen PHEBE PONCE, MD as PCP - General (Family Medicine) Bernie Lamar PARAS, MD as PCP - Cardiology (Cardiology)

## 2024-12-14 ENCOUNTER — Ambulatory Visit: Payer: Self-pay | Admitting: Gastroenterology

## 2024-12-14 ENCOUNTER — Encounter: Payer: Self-pay | Admitting: Gastroenterology

## 2024-12-14 VITALS — BP 127/80 | HR 76 | Temp 97.5°F | Resp 24 | Ht 73.0 in | Wt 248.6 lb

## 2024-12-14 DIAGNOSIS — K621 Rectal polyp: Secondary | ICD-10-CM

## 2024-12-14 DIAGNOSIS — R1310 Dysphagia, unspecified: Secondary | ICD-10-CM

## 2024-12-14 DIAGNOSIS — D128 Benign neoplasm of rectum: Secondary | ICD-10-CM

## 2024-12-14 DIAGNOSIS — K644 Residual hemorrhoidal skin tags: Secondary | ICD-10-CM

## 2024-12-14 DIAGNOSIS — K2100 Gastro-esophageal reflux disease with esophagitis, without bleeding: Secondary | ICD-10-CM

## 2024-12-14 DIAGNOSIS — K635 Polyp of colon: Secondary | ICD-10-CM

## 2024-12-14 DIAGNOSIS — K648 Other hemorrhoids: Secondary | ICD-10-CM

## 2024-12-14 DIAGNOSIS — D123 Benign neoplasm of transverse colon: Secondary | ICD-10-CM

## 2024-12-14 DIAGNOSIS — R0989 Other specified symptoms and signs involving the circulatory and respiratory systems: Secondary | ICD-10-CM

## 2024-12-14 DIAGNOSIS — Z860101 Personal history of adenomatous and serrated colon polyps: Secondary | ICD-10-CM

## 2024-12-14 DIAGNOSIS — K219 Gastro-esophageal reflux disease without esophagitis: Secondary | ICD-10-CM

## 2024-12-14 DIAGNOSIS — K2281 Esophageal polyp: Secondary | ICD-10-CM

## 2024-12-14 DIAGNOSIS — D125 Benign neoplasm of sigmoid colon: Secondary | ICD-10-CM

## 2024-12-14 DIAGNOSIS — Z1211 Encounter for screening for malignant neoplasm of colon: Secondary | ICD-10-CM

## 2024-12-14 MED ORDER — PANTOPRAZOLE SODIUM 40 MG PO TBEC: DELAYED_RELEASE_TABLET | ORAL | 4 refills | Status: AC

## 2024-12-14 MED ORDER — SODIUM CHLORIDE 0.9 % IV SOLN: 500.0000 mL | Freq: Once | INTRAVENOUS | Status: DC

## 2024-12-14 NOTE — Op Note (Signed)
 Eric Myers Patient Name: Eric Myers Procedure Date: 12/14/2024 7:03 AM MRN: [id number] Endoscopist: Gustav ALONSO Mcgee , MD, [id number] Age: 64 Referring MD:  Date of Birth: 1961-11-22 Gender: Male Account #: [id number] Procedure:                Colonoscopy Indications:              High risk colon cancer surveillance: Personal                            history of adenoma less than 10 mm in size Medicines:                Monitored Anesthesia Care Procedure:                Pre-Anesthesia Assessment:                           - Prior to the procedure, a History and Physical                            was performed, and patient medications and                            allergies were reviewed. The patient's tolerance of                            previous anesthesia was also reviewed. The risks                            and benefits of the procedure and the sedation                            options and risks were discussed with the patient.                            All questions were answered, and informed consent                            was obtained. Prior Anticoagulants: The patient has                            taken no anticoagulant or antiplatelet agents. ASA                            Grade Assessment: II - A patient with mild systemic                            disease. After reviewing the risks and benefits,                            the patient was deemed in satisfactory condition to                            undergo the procedure.  After obtaining informed consent, the colonoscope                            was passed under direct vision. Throughout the                            procedure, the patient's blood pressure, pulse, and                            oxygen saturations were monitored continuously. The                            [id number]-HQ190L Colonoscope [id number] was introduced                            through the anus  and advanced to the the cecum,                            identified by appendiceal orifice and ileocecal                            valve. The colonoscopy was performed without                            difficulty. The patient tolerated the procedure                            well. The quality of the bowel preparation was                            good. The ileocecal valve, appendiceal orifice, and                            rectum were photographed. Scope In: 8:31:20 AM Scope Out: 8:44:23 AM Scope Withdrawal Time: 0 hours 10 minutes 53 seconds  Total Procedure Duration: 0 hours 13 minutes 3 seconds  Findings:                 The perianal and digital rectal examinations were                            normal.                           Four sessile polyps were found in the rectum,                            sigmoid colon and transverse colon. The polyps were                            3 to 5 mm in size. These polyps were removed with a                            cold snare. Resection and retrieval were complete.  Non-bleeding external and internal hemorrhoids were                            found during retroflexion. The hemorrhoids were                            medium-sized. Complications:            No immediate complications. Estimated Blood Loss:     Estimated blood loss was minimal. Impression:               - Four 3 to 5 mm polyps in the rectum, in the                            sigmoid colon and in the transverse colon, removed                            with a cold snare. Resected and retrieved.                           - Non-bleeding external and internal hemorrhoids. Recommendation:           - Resume previous diet.                           - Continue present medications.                           - Await pathology results.                           - Repeat colonoscopy in 3 - 5 years for                            surveillance based on  pathology results. Eric Buckman V. Kasin Tonkinson, MD 12/14/2024 8:53:34 AM This report has been signed electronically.

## 2024-12-14 NOTE — Progress Notes (Unsigned)
 [facility] History and Physical   Primary Care Physician:  Vicci Odor, [place]   Reason for Procedure:  GERD, dysphagia, h/o colon adenoma  Plan:    EGD and colonoscopy with possible interventions as needed     HPI: Eric Myers is a very pleasant 64 y.o. male here for EGD and colonoscopy for GERD, dysphagia, h/o colon adenoma. Please refer to office visit note by Camie Furbish for details The risks and benefits as well as alternatives of endoscopic procedure(s) have been discussed and reviewed.  The patient was provided an opportunity to ask questions and all were answered. The patient agreed with the plan and demonstrated an understanding of the instructions.   Past Medical History:  Diagnosis Date   Acute pulmonary embolism (HCC) 02/26/2017   Ascending aortic aneurysm (HCC) 4.3 cm in 2021 09/03/2020   Asthma    Chronic right shoulder pain 08/19/2016   Dyslipidemia 07/17/2020   Dyspnea on exertion 07/17/2020   History of pulmonary embolism 07/17/2020   Pleuritic chest pain 02/26/2017   Pulmonary embolism (HCC)    Pulmonary nodule less than 1 cm in diameter with low risk for malignant neoplasm 02/22/2017   Right lower lobe   RLL pneumonia 02/26/2017    Past Surgical History:  Procedure Laterality Date   ROTATOR CUFF REPAIR Right 2018   TONSILLECTOMY  1990    Prior to Admission medications  Medication Sig Start Date End Date Taking? Authorizing Provider  Multiple Vitamin (MULTIVITAMIN) capsule Take 1 capsule by mouth daily.   Yes [provider]  IBU 800 MG tablet Take 800 mg by mouth 3 (three) times daily as needed for mild pain (pain score 1-3) or moderate pain (pain score 4-6). 08/18/20   [provider]    Current Outpatient Medications  Medication Sig Dispense Refill   Multiple Vitamin (MULTIVITAMIN) capsule Take 1 capsule by mouth daily.     IBU 800 MG tablet Take 800 mg by mouth 3 (three) times daily as needed for mild pain (pain score 1-3)  or moderate pain (pain score 4-6).     Current Facility-Administered Medications  Medication Dose Route Frequency Provider Last Rate Last Admin   0.9 %  sodium chloride  infusion  500 mL Intravenous Once Kyanne Rials V, MD        Allergies as of 12/14/2024 - Review Complete 12/14/2024  Allergen Reaction Noted   Morphine  and codeine Itching and Other (See Comments) 02/26/2017    Family History  Problem Relation Age of Onset   Alzheimer's disease Mother    Dementia Mother    Hypothyroidism Mother    Alzheimer's disease Father    Dementia Father    CAD Father    Liver cancer Maternal Grandfather    Colon cancer Neg Hx    Stomach cancer Neg Hx    Rectal cancer Neg Hx    Esophageal cancer Neg Hx     Social History   Socioeconomic History   Marital status: Married    Spouse name: Not on file   Number of children: Not on file   Years of education: Not on file   Highest education level: Not on file  Occupational History   Not on file  Tobacco Use   Smoking status: Never   Smokeless tobacco: Never  Vaping Use   Vaping status: Never Used  Substance and Sexual Activity   Alcohol use: No   Drug use: No   Sexual activity: Not on file  Other Topics Concern  Not on file  Social History Narrative   Not on file   Social Drivers of Health   Tobacco Use: Low Risk (12/14/2024)   Patient History    Smoking Tobacco Use: Never    Smokeless Tobacco Use: Never    Passive Exposure: Not on file  Financial Resource Strain: Not on file  Food Insecurity: Not on file  Transportation Needs: Not on file  Physical Activity: Not on file  Stress: Not on file  Social Connections: Not on file  Intimate Partner Violence: Not on file  Depression (EYV7-0): Not on file  Alcohol Screen: Not on file  Housing: Not on file  Utilities: Not on file  Health Literacy: Not on file    Review of Systems:  All other review of systems negative except as mentioned in the HPI.  Physical  Exam: Vital signs in last 24 hours: BP 130/87   Pulse 79   Temp (!) 97.5 F (36.4 C)   Resp 15   Ht 6' 1 (1.854 m)   Wt 248 lb 9.6 oz (112.8 kg)   SpO2 98%   BMI 32.80 kg/m  General:   Alert, NAD Lungs:  Clear .   Heart:  Regular rate and rhythm Abdomen:  Soft, nontender and nondistended. Neuro/Psych:  Alert and cooperative. Normal mood and affect. A and O x 3  Reviewed labs, radiology imaging, old records and pertinent past GI work up  Patient is appropriate for planned procedure(s) and anesthesia in an ambulatory setting   K. Veena Onix Jumper , MD [phone]

## 2024-12-14 NOTE — Progress Notes (Unsigned)
 Per Dr. Shila- not to schedule EGD today; wait until OV to set this up.

## 2024-12-14 NOTE — Patient Instructions (Signed)
 Discharge instructions given. Handouts on polyps,Hemorrhoids and Hiatal Hernia. Prescription sent to pharmacy. Resume previous medications. YOU HAD AN ENDOSCOPIC PROCEDURE TODAY AT THE [facility]:   Refer to the procedure report that was given to you for any specific questions about what was found during the examination.  If the procedure report does not answer your questions, please call your gastroenterologist to clarify.  If you requested that your care partner not be given the details of your procedure findings, then the procedure report has been included in a sealed envelope for you to review at your convenience later.  YOU SHOULD EXPECT: Some feelings of bloating in the abdomen. Passage of more gas than usual.  Walking can help get rid of the air that was put into your GI tract during the procedure and reduce the bloating. If you had a lower endoscopy (such as a colonoscopy or flexible sigmoidoscopy) you may notice spotting of blood in your stool or on the toilet paper. If you underwent a bowel prep for your procedure, you may not have a normal bowel movement for a few days.  Please Note:  You might notice some irritation and congestion in your nose or some drainage.  This is from the oxygen used during your procedure.  There is no need for concern and it should clear up in a day or so.  SYMPTOMS TO REPORT IMMEDIATELY:  Following lower endoscopy (colonoscopy or flexible sigmoidoscopy):  Excessive amounts of blood in the stool  Significant tenderness or worsening of abdominal pains  Swelling of the abdomen that is new, acute  Fever of 100F or higher  Following upper endoscopy (EGD)  Vomiting of blood or coffee ground material  New chest pain or pain under the shoulder blades  Painful or persistently difficult swallowing  New shortness of breath  Fever of 100F or higher  Black, tarry-looking stools  For urgent or emergent issues, a gastroenterologist can be reached at  any hour by calling [phone]. Do not use MyChart messaging for urgent concerns.    DIET:  We do recommend a small meal at first, but then you may proceed to your regular diet.  Drink plenty of fluids but you should avoid alcoholic beverages for 24 hours.  ACTIVITY:  You should plan to take it easy for the rest of today and you should NOT DRIVE or use heavy machinery until tomorrow (because of the sedation medicines used during the test).    FOLLOW UP: Our staff will call the number listed on your records the next business day following your procedure.  We will call around 7:15- 8:00 am to check on you and address any questions or concerns that you may have regarding the information given to you following your procedure. If we do not reach you, we will leave a message.     If any biopsies were taken you will be contacted by phone or by letter within the next 1-3 weeks.  Please call us  at [phone] if you have not heard about the biopsies in 3 weeks.    SIGNATURES/CONFIDENTIALITY: You and/or your care partner have signed paperwork which will be entered into your electronic medical record.  These signatures attest to the fact that that the information above on your After Visit Summary has been reviewed and is understood.  Full responsibility of the confidentiality of this discharge information lies with you and/or your care-partner.

## 2024-12-14 NOTE — Op Note (Signed)
 [facility] Patient Name: Eric Myers Procedure Date: 12/14/2024 7:30 AM MRN: [id number] Endoscopist: Gustav ALONSO Mcgee , MD, [id number] Age: 64 Referring MD:  Date of Birth: 09-15-1961 Gender: Male Account #: [id number] Procedure:                Upper GI endoscopy Indications:              Dysphagia, Esophageal reflux symptoms that persist                            despite appropriate therapy Medicines:                Monitored Anesthesia Care Procedure:                Pre-Anesthesia Assessment:                           - Prior to the procedure, a History and Physical                            was performed, and patient medications and                            allergies were reviewed. The patient's tolerance of                            previous anesthesia was also reviewed. The risks                            and benefits of the procedure and the sedation                            options and risks were discussed with the patient.                            All questions were answered, and informed consent                            was obtained. Prior Anticoagulants: The patient has                            taken no anticoagulant or antiplatelet agents. ASA                            Grade Assessment: II - A patient with mild systemic                            disease. After reviewing the risks and benefits,                            the patient was deemed in satisfactory condition to                            undergo the procedure.  After obtaining informed consent, the endoscope was                            passed under direct vision. Throughout the                            procedure, the patient's blood pressure, pulse, and                            oxygen saturations were monitored continuously. The                            Olympus Scope SN [id number] was introduced through the                            mouth, and  advanced to the second part of duodenum.                            The upper GI endoscopy was accomplished without                            difficulty. The patient tolerated the procedure                            well. Scope In: Scope Out: Findings:                 LA Grade B (one or more mucosal breaks greater than                            5 mm, not extending between the tops of two mucosal                            folds) esophagitis with no bleeding was found 37 to                            38 cm from the incisors.                           A single 15 mm mucosal nodule was found at the                            gastroesophageal junction, 40 cm from the incisors.                            The nodule was Paris classification Is (protruding,                            sessile). Biopsies were taken with a cold forceps                            for histology.  A 2 cm hiatal hernia was present.                           The stomach was normal.                           The cardia and gastric fundus were normal on                            retroflexion.                           The examined duodenum was normal. Complications:            No immediate complications. Estimated Blood Loss:     Estimated blood loss was minimal. Impression:               - LA Grade B reflux esophagitis with no bleeding.                           - Mucosal nodule found in the esophagus. Biopsied.                           - 2 cm hiatal hernia.                           - Normal stomach.                           - Normal examined duodenum. Recommendation:           - Resume previous diet.                           - Continue present medications.                           - Await pathology results.                           - Follow an antireflux regimen.                           - Use Protonix  (pantoprazole ) 40 mg PO BID for 3                            months followed by  40mg  daily.                           - Return to GI office in 2 months with POD C APP.                           - Repeat upper endoscopy in 3 months to evaluate                            the response to therapy based on path results. Babe Anthis V. Savien Mamula, MD 12/14/2024 9:01:58 AM This report has been signed  electronically.

## 2024-12-14 NOTE — Progress Notes (Unsigned)
 Sedate, gd SR, tolerated procedure well, VSS, report to RN

## 2024-12-14 NOTE — Progress Notes (Unsigned)
 Called to room to assist during endoscopic procedure.  Patient ID and intended procedure confirmed with present staff. Received instructions for my participation in the procedure from the performing physician.

## 2024-12-17 ENCOUNTER — Telehealth: Payer: Self-pay

## 2024-12-17 NOTE — Telephone Encounter (Signed)
 No answer on follow up call - voice mail message left

## 2024-12-20 LAB — SURGICAL PATHOLOGY

## 2025-01-03 ENCOUNTER — Ambulatory Visit: Payer: Self-pay | Admitting: Gastroenterology

## 2025-02-11 ENCOUNTER — Ambulatory Visit: Payer: Self-pay | Admitting: Gastroenterology
